# Patient Record
Sex: Male | Born: 1965 | Race: White | Hispanic: No | Marital: Married | Smoking: Current every day smoker
Health system: Southern US, Community
[De-identification: ages and names within clinical notes are randomized; demographics above are authoritative.]

## PROBLEM LIST (undated history)

## (undated) ENCOUNTER — Emergency Department (HOSPITAL_COMMUNITY): Payer: 59 | Source: Home / Self Care

## (undated) DIAGNOSIS — J449 Chronic obstructive pulmonary disease, unspecified: Secondary | ICD-10-CM

## (undated) DIAGNOSIS — G4730 Sleep apnea, unspecified: Secondary | ICD-10-CM

## (undated) DIAGNOSIS — F1020 Alcohol dependence, uncomplicated: Secondary | ICD-10-CM

## (undated) DIAGNOSIS — K219 Gastro-esophageal reflux disease without esophagitis: Secondary | ICD-10-CM

## (undated) DIAGNOSIS — F419 Anxiety disorder, unspecified: Secondary | ICD-10-CM

## (undated) DIAGNOSIS — N200 Calculus of kidney: Secondary | ICD-10-CM

## (undated) DIAGNOSIS — N400 Enlarged prostate without lower urinary tract symptoms: Secondary | ICD-10-CM

## (undated) DIAGNOSIS — Z87442 Personal history of urinary calculi: Secondary | ICD-10-CM

## (undated) DIAGNOSIS — E785 Hyperlipidemia, unspecified: Secondary | ICD-10-CM

## (undated) HISTORY — DX: Calculus of kidney: N20.0

## (undated) HISTORY — PX: APPENDECTOMY: SHX54

## (undated) HISTORY — PX: RHINOPLASTY: SUR1284

## (undated) HISTORY — PX: SHOULDER SURGERY: SHX246

## (undated) HISTORY — DX: Anxiety disorder, unspecified: F41.9

## (undated) HISTORY — DX: Alcohol dependence, uncomplicated: F10.20

---

## 1993-11-17 HISTORY — PX: NASAL SEPTOPLASTY W/ TURBINOPLASTY: SHX2070

## 1993-11-17 HISTORY — PX: RHINOPLASTY: SUR1284

## 1999-09-27 ENCOUNTER — Emergency Department (HOSPITAL_COMMUNITY): Admission: EM | Admit: 1999-09-27 | Discharge: 1999-09-27 | Payer: Self-pay | Admitting: Emergency Medicine

## 2001-11-17 HISTORY — PX: WISDOM TOOTH EXTRACTION: SHX21

## 2003-11-18 HISTORY — PX: APPENDECTOMY: SHX54

## 2003-11-18 HISTORY — PX: SHOULDER SURGERY: SHX246

## 2005-12-29 ENCOUNTER — Encounter: Admission: RE | Admit: 2005-12-29 | Discharge: 2005-12-29 | Payer: Self-pay | Admitting: Orthopedic Surgery

## 2006-01-28 ENCOUNTER — Ambulatory Visit (HOSPITAL_COMMUNITY): Admission: RE | Admit: 2006-01-28 | Discharge: 2006-01-28 | Payer: Self-pay | Admitting: Orthopedic Surgery

## 2006-01-28 HISTORY — PX: SHOULDER ARTHROSCOPY: SHX128

## 2006-08-28 ENCOUNTER — Ambulatory Visit (HOSPITAL_BASED_OUTPATIENT_CLINIC_OR_DEPARTMENT_OTHER): Admission: RE | Admit: 2006-08-28 | Discharge: 2006-08-28 | Payer: Self-pay | Admitting: Urology

## 2006-08-28 ENCOUNTER — Encounter (INDEPENDENT_AMBULATORY_CARE_PROVIDER_SITE_OTHER): Payer: Self-pay | Admitting: Specialist

## 2006-08-28 HISTORY — PX: CYSTOSCOPY W/ RETROGRADES: SHX1426

## 2006-09-03 ENCOUNTER — Ambulatory Visit (HOSPITAL_COMMUNITY): Admission: RE | Admit: 2006-09-03 | Discharge: 2006-09-03 | Payer: Self-pay | Admitting: Urology

## 2008-11-17 HISTORY — PX: CARDIAC CATHETERIZATION: SHX172

## 2008-11-18 ENCOUNTER — Encounter: Admission: RE | Admit: 2008-11-18 | Discharge: 2008-11-18 | Payer: Self-pay | Admitting: Orthopedic Surgery

## 2008-11-22 ENCOUNTER — Ambulatory Visit (HOSPITAL_COMMUNITY): Admission: RE | Admit: 2008-11-22 | Discharge: 2008-11-22 | Payer: Self-pay | Admitting: Orthopedic Surgery

## 2008-11-22 HISTORY — PX: SHOULDER ARTHROSCOPY: SHX128

## 2009-04-10 ENCOUNTER — Other Ambulatory Visit: Payer: Self-pay | Admitting: Emergency Medicine

## 2009-04-10 ENCOUNTER — Observation Stay (HOSPITAL_COMMUNITY): Admission: EM | Admit: 2009-04-10 | Discharge: 2009-04-11 | Payer: Self-pay | Admitting: Emergency Medicine

## 2010-04-19 IMAGING — CR DG CHEST 2V
2 series · 2 of 2 positions shown · non-contrast
Comparison: November 22, 2008

CLINICAL DATA: Chest pain, smoker, shortness of breath

CHEST - 2 VIEW

[w chest pa]
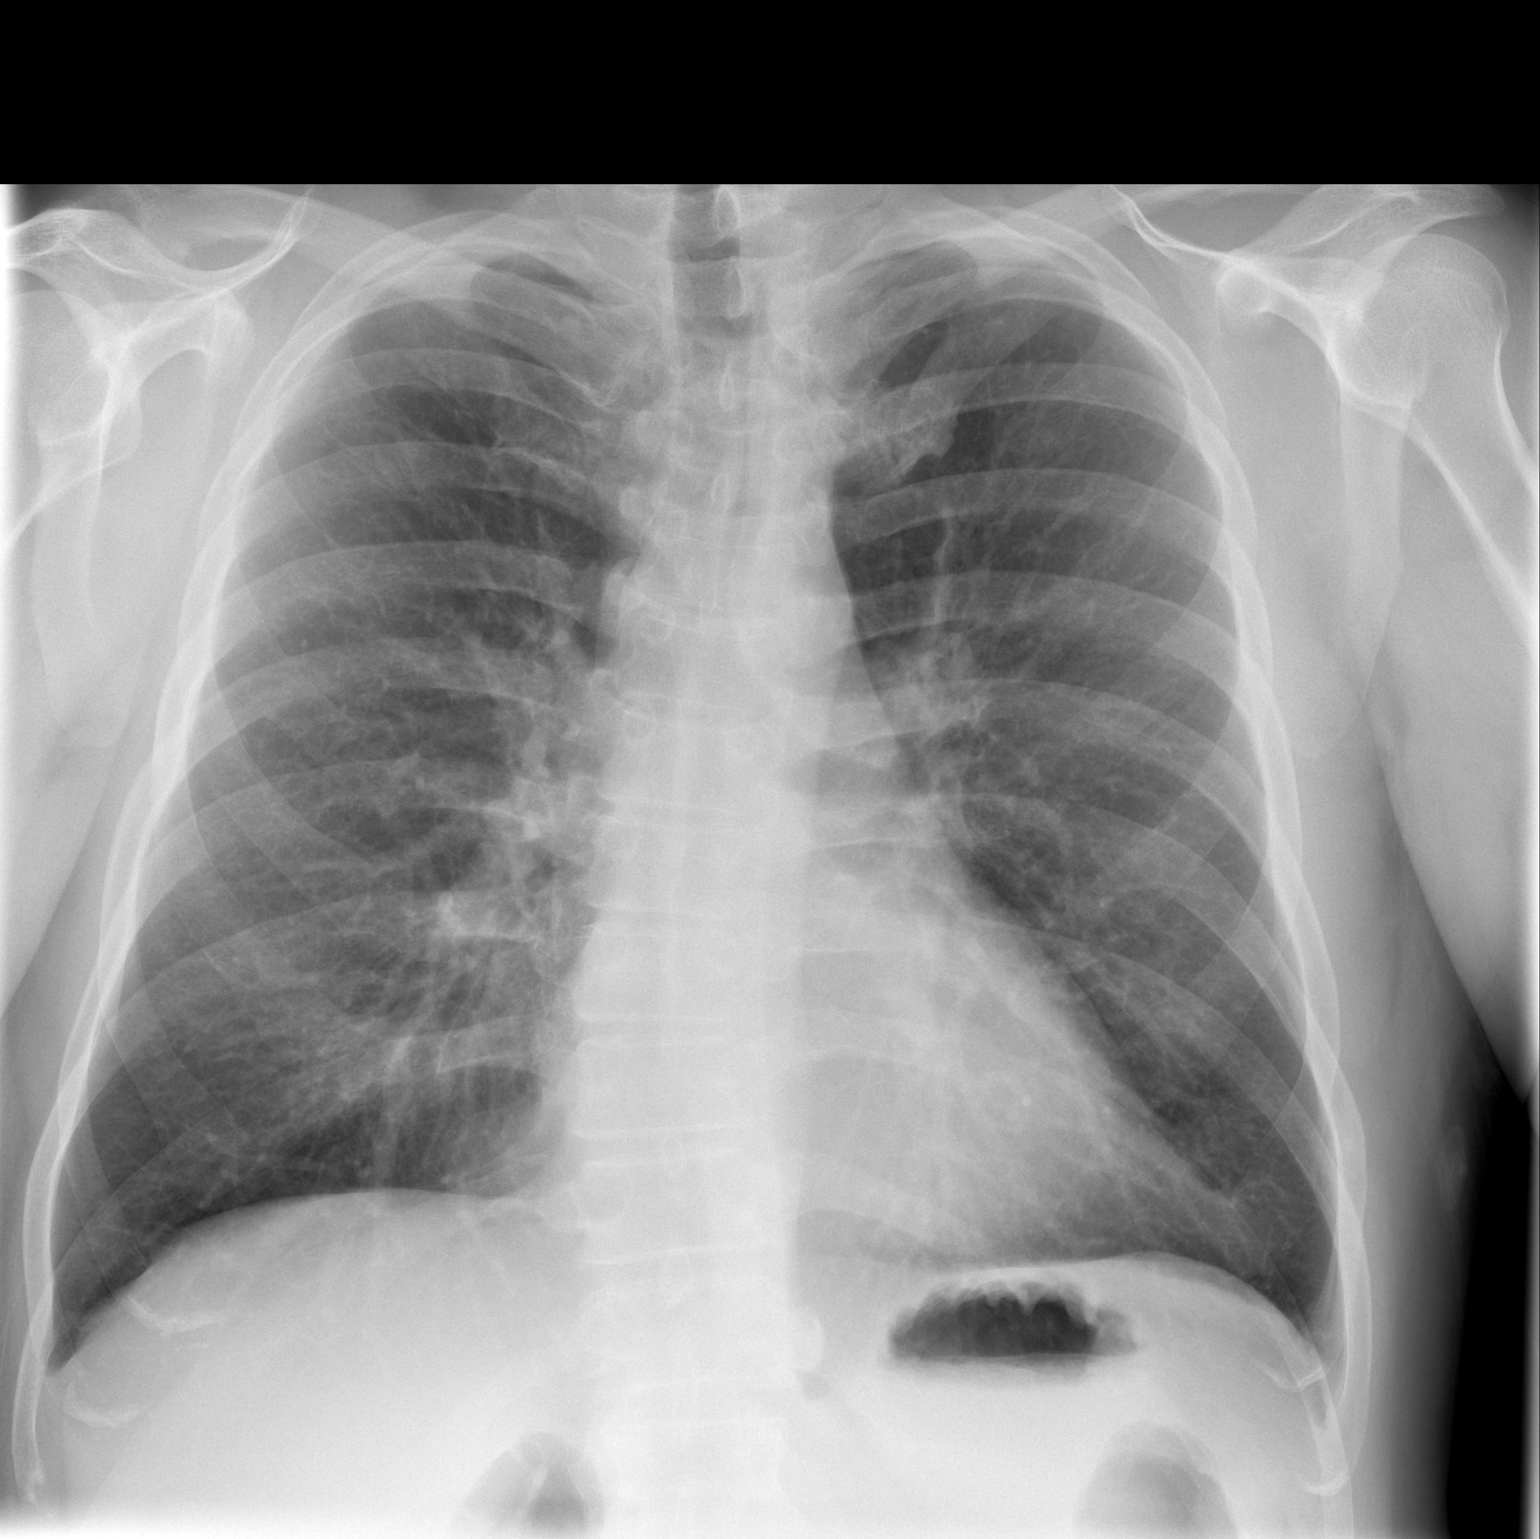

[w chest lat]
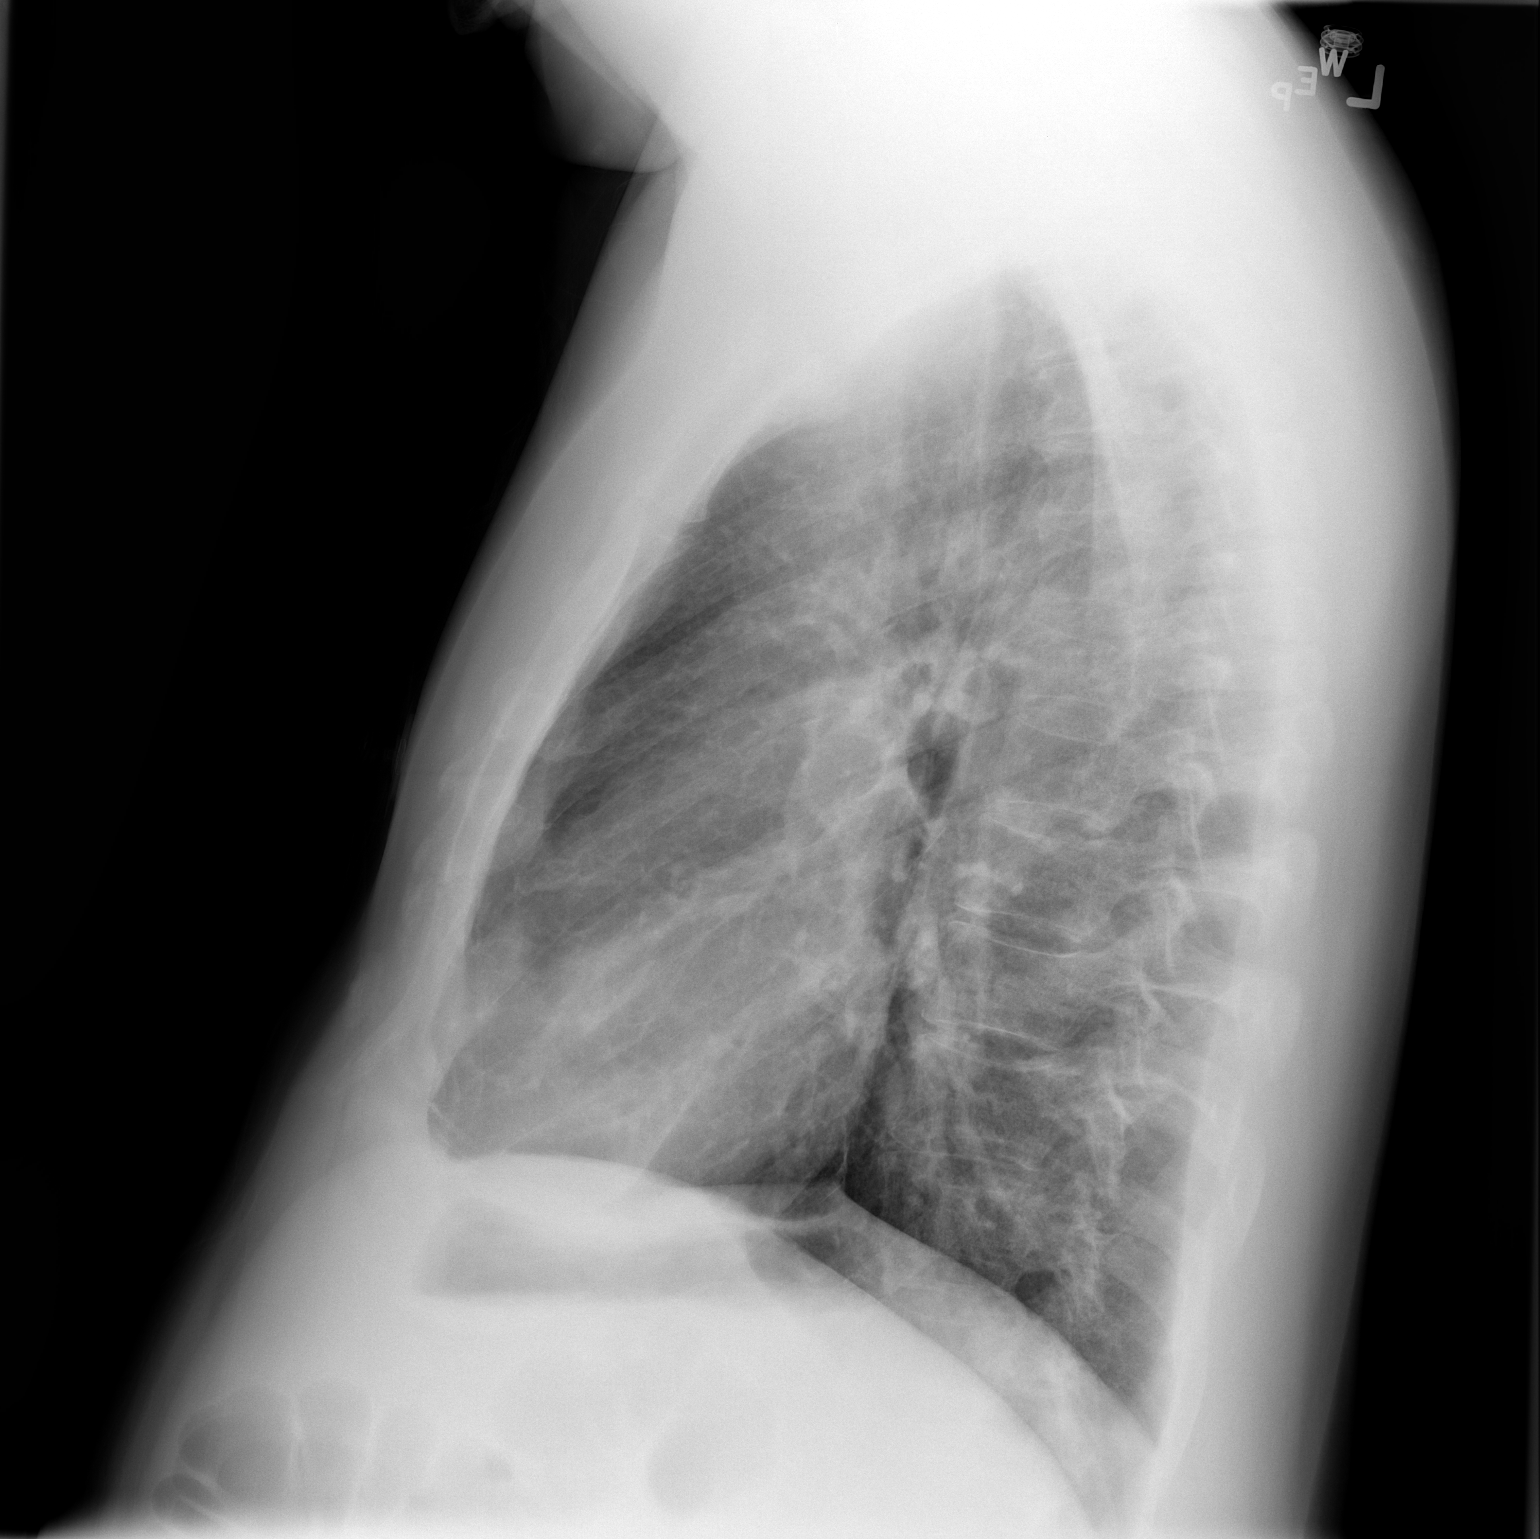

[2 of 2 positions shown; findings below may reference images not displayed]

FINDINGS: The cardiac silhouette, mediastinum, pulmonary
vasculature are within normal limits.  Both lungs are clear except
for mild, diffuse prominence of the interstitial markings, likely
related to chronic fibrotic change.  The osseous structures are
unremarkable.
IMPRESSION: Stable chest x-ray with no evidence of acute cardiac or pulmonary
process.

## 2010-06-04 ENCOUNTER — Ambulatory Visit (HOSPITAL_COMMUNITY): Admission: RE | Admit: 2010-06-04 | Discharge: 2010-06-04 | Payer: Self-pay | Admitting: Advanced Practice Midwife

## 2010-08-16 ENCOUNTER — Encounter: Admission: RE | Admit: 2010-08-16 | Discharge: 2010-08-16 | Payer: Self-pay | Admitting: Family Medicine

## 2011-01-21 ENCOUNTER — Emergency Department (HOSPITAL_COMMUNITY)
Admission: EM | Admit: 2011-01-21 | Discharge: 2011-01-22 | Disposition: A | Discharge status: Home / Self Care | Payer: BC Managed Care – PPO | Attending: Emergency Medicine | Admitting: Emergency Medicine

## 2011-01-21 DIAGNOSIS — R55 Syncope and collapse: Secondary | ICD-10-CM | POA: Insufficient documentation

## 2011-01-21 DIAGNOSIS — R209 Unspecified disturbances of skin sensation: Secondary | ICD-10-CM | POA: Insufficient documentation

## 2011-01-21 DIAGNOSIS — R42 Dizziness and giddiness: Secondary | ICD-10-CM | POA: Insufficient documentation

## 2011-01-21 DIAGNOSIS — G4730 Sleep apnea, unspecified: Secondary | ICD-10-CM | POA: Insufficient documentation

## 2011-01-21 DIAGNOSIS — K219 Gastro-esophageal reflux disease without esophagitis: Secondary | ICD-10-CM | POA: Insufficient documentation

## 2011-01-21 LAB — DIFFERENTIAL
Eosinophils Absolute: 0.1 10*3/uL (ref 0.0–0.7)
Eosinophils Relative: 1 % (ref 0–5)
Lymphocytes Relative: 25 % (ref 12–46)
Lymphs Abs: 2.8 10*3/uL (ref 0.7–4.0)
Monocytes Absolute: 0.8 10*3/uL (ref 0.1–1.0)

## 2011-01-21 LAB — CBC
HCT: 40.8 % (ref 39.0–52.0)
MCHC: 34.8 g/dL (ref 30.0–36.0)
MCV: 92.7 fL (ref 78.0–100.0)
Platelets: 205 10*3/uL (ref 150–400)
RDW: 13.9 % (ref 11.5–15.5)

## 2011-02-25 LAB — LIPID PANEL
Cholesterol: 165 mg/dL (ref 0–200)
HDL: 30 mg/dL — ABNORMAL LOW (ref >39)
Total CHOL/HDL Ratio: 5.5 RATIO
VLDL: 55 mg/dL — ABNORMAL HIGH (ref 0–40)

## 2011-02-25 LAB — CARDIAC PANEL(CRET KIN+CKTOT+MB+TROPI)
CK, MB: 0.7 ng/mL (ref 0.3–4.0)
CK, MB: 0.9 ng/mL (ref 0.3–4.0)
Relative Index: 0.5 (ref 0.0–2.5)
Total CK: 130 U/L (ref 7–232)
Total CK: 134 U/L (ref 7–232)
Total CK: 156 U/L (ref 7–232)
Troponin I: 0.01 ng/mL (ref 0.00–0.06)
Troponin I: 0.01 ng/mL (ref 0.00–0.06)

## 2011-02-25 LAB — COMPREHENSIVE METABOLIC PANEL
ALT: 20 U/L (ref 0–53)
AST: 20 U/L (ref 0–37)
Albumin: 3.9 g/dL (ref 3.5–5.2)
Chloride: 106 mEq/L (ref 96–112)
Creatinine, Ser: 1.05 mg/dL (ref 0.4–1.5)
GFR calc Af Amer: >60 mL/min (ref >60)
Potassium: 3.7 mEq/L (ref 3.5–5.1)
Sodium: 139 mEq/L (ref 135–145)
Total Bilirubin: 0.7 mg/dL (ref 0.3–1.2)

## 2011-02-25 LAB — CBC
Hemoglobin: 13.7 g/dL (ref 13.0–17.0)
MCHC: 35 g/dL (ref 30.0–36.0)
Platelets: 201 10*3/uL (ref 150–400)
Platelets: 229 10*3/uL (ref 150–400)
RBC: 4.41 MIL/uL (ref 4.22–5.81)
RDW: 14.3 % (ref 11.5–15.5)
WBC: 6.8 10*3/uL (ref 4.0–10.5)

## 2011-02-25 LAB — URINALYSIS, ROUTINE W REFLEX MICROSCOPIC
Bilirubin Urine: NEGATIVE
Glucose, UA: NEGATIVE mg/dL
Ketones, ur: 15 mg/dL — AB
pH: 7 (ref 5.0–8.0)

## 2011-02-25 LAB — DIFFERENTIAL
Basophils Absolute: 0 10*3/uL (ref 0.0–0.1)
Eosinophils Absolute: 0.1 10*3/uL (ref 0.0–0.7)
Eosinophils Relative: 2 % (ref 0–5)
Lymphocytes Relative: 39 % (ref 12–46)
Monocytes Absolute: 0.5 10*3/uL (ref 0.1–1.0)

## 2011-02-25 LAB — APTT: aPTT: 68 seconds — ABNORMAL HIGH (ref 24–37)

## 2011-02-25 LAB — POCT CARDIAC MARKERS
CKMB, poc: <1 ng/mL — ABNORMAL LOW (ref 1.0–8.0)
Myoglobin, poc: 59.6 ng/mL (ref 12–200)
Myoglobin, poc: 64.4 ng/mL (ref 12–200)
Troponin i, poc: <0.05 ng/mL (ref 0.00–0.09)
Troponin i, poc: <0.05 ng/mL (ref 0.00–0.09)

## 2011-02-25 LAB — BASIC METABOLIC PANEL
Calcium: 8.5 mg/dL (ref 8.4–10.5)
GFR calc Af Amer: >60 mL/min (ref >60)
GFR calc non Af Amer: >60 mL/min (ref >60)
Glucose, Bld: 126 mg/dL — ABNORMAL HIGH (ref 70–99)
Potassium: 3.8 mEq/L (ref 3.5–5.1)
Sodium: 142 mEq/L (ref 135–145)

## 2011-02-25 LAB — PROTIME-INR
INR: 1 (ref 0.00–1.49)
Prothrombin Time: 13.3 seconds (ref 11.6–15.2)

## 2011-02-25 LAB — TROPONIN I: Troponin I: 0.01 ng/mL (ref 0.00–0.06)

## 2011-02-25 LAB — CK TOTAL AND CKMB (NOT AT ARMC): Total CK: 135 U/L (ref 7–232)

## 2011-03-03 LAB — CBC
HCT: 42.8 % (ref 39.0–52.0)
Hemoglobin: 14.3 g/dL (ref 13.0–17.0)
MCHC: 33.4 g/dL (ref 30.0–36.0)
Platelets: 249 10*3/uL (ref 150–400)
RDW: 14 % (ref 11.5–15.5)

## 2011-03-03 LAB — URINALYSIS, ROUTINE W REFLEX MICROSCOPIC
Bilirubin Urine: NEGATIVE
Hgb urine dipstick: NEGATIVE
Nitrite: NEGATIVE
Protein, ur: NEGATIVE mg/dL
Urobilinogen, UA: 0.2 mg/dL (ref 0.0–1.0)

## 2011-03-03 LAB — BASIC METABOLIC PANEL
BUN: 11 mg/dL (ref 6–23)
CO2: 27 mEq/L (ref 19–32)
Calcium: 9.3 mg/dL (ref 8.4–10.5)
GFR calc non Af Amer: >60 mL/min (ref >60)
Glucose, Bld: 101 mg/dL — ABNORMAL HIGH (ref 70–99)
Potassium: 4.7 mEq/L (ref 3.5–5.1)
Sodium: 139 mEq/L (ref 135–145)

## 2011-03-18 DIAGNOSIS — R0789 Other chest pain: Secondary | ICD-10-CM

## 2011-03-18 HISTORY — DX: Other chest pain: R07.89

## 2011-03-18 HISTORY — PX: CARDIAC CATHETERIZATION: SHX172

## 2011-04-01 NOTE — Op Note (Signed)
NAMESEMISI, BIELA NO.:  [id number]   MEDICAL RECORD NO.:  [id number]          PATIENT TYPE:  OIB   LOCATION:  2899                         FACILITY:  [facility]   PHYSICIAN:  Dyke Brackett, M.D.    DATE OF BIRTH:  Oct 24, 1966   DATE OF PROCEDURE:  11/22/2008  DATE OF DISCHARGE:  11/22/2008                               OPERATIVE REPORT   INDICATIONS:  A 45 year old with intractable right shoulder pain.  MRI  suggesting labral pathology, impingement, AC arthritis, thought to be  amenable to outpatient surgery.   PREOPERATIVE DIAGNOSES:  1. Impingement right shoulder.  2. Labral tearing due to anterior superior tear.  3. Acromioclavicular joint arthritis.   PREOPERATIVE DIAGNOSES:  1. Impingement right shoulder.  2. Labral tearing due to anterior superior tear.  3. Acromioclavicular joint arthritis.   OPERATION:  1. Arthroscopic acromioplasty.  2. Arthroscopic debridement torn labrum.  3. Arthroscopic excision, distal clavicle.   SURGEON:  Dyke Brackett, MD   ASSISTANT:  __________.   ANESTHESIA:  __________.   PROCEDURE:  Examination was done under anesthesia, normal range of  motion.  No instability.  Arthroscope through 2 posterior portals,  lateral and anterior portal.  Systematic inspection of the shoulder  showed a degenerative tear in anterior superior labrum, debrided  separate from the subacromial area.  Undersurface of the cuff appeared  normal.  No significant glenohumeral degenerative change, but the labrum  was torn and debrided.   Subacromial space was extremely hypertrophied.  Significant impingement  noted.  Acromioplasty carried out with resection of about the anterior 6  mm of acromion, severe AC arthritis was encountered requiring resection  of a very  arthritic,  hypertrophic AC joint arthroscopically from the lateral and  anterior portal.  The superior surface of the cuff showed an abrasion  type phenomenon with no full-thickness  tear, bursectomy carried out.  Shoulder __________ closed with nylon, placed in a large sling, taken to  recovery room after a sterile dressing.      Dyke Brackett, M.D.  Electronically Signed     WDC/MEDQ  D:  11/22/2008  T:  11/23/2008  Job:  [id number]

## 2011-04-01 NOTE — Cardiovascular Report (Signed)
NAMEALIZE, ACY NO.:  [id number]   MEDICAL RECORD NO.:  [id number]          PATIENT TYPE:  INP   LOCATION:  4730                         FACILITY:  [facility]   PHYSICIAN:  Vesta Mixer, M.D. DATE OF BIRTH:  [birth year]   DATE OF PROCEDURE:  04/11/2009  DATE OF DISCHARGE:  04/11/2009                            CARDIAC CATHETERIZATION   Billy Thomas is a 45 year old gentleman with a history of  hypercholesterolemia.  He represented to the emergency room last night  with episodes of chest pain.  He is referred for heart catheterization  based on his symptoms.   The procedure was left heart catheterization with coronary angiography.   The right femoral artery was easily cannulated using modified Seldinger  technique.   HEMODYNAMICS:  LV pressure is 87/17 with an aortic pressure of 88/67.   ANGIOGRAPHY:  Left main.  The left main is smooth and normal.   The left anterior descending artery is smooth and normal.  The first  diagonal artery is normal.   The left circumflex artery is a moderate-sized vessel.  The obtuse  marginal artery is normal.   The ramus intermediate vessel is smooth and normal.   The right coronary artery is moderate in size and is dominant.  It is  smooth and normal.  The posterior descending artery and the  posterolateral segment artery are normal.   The left ventriculogram was performed in a 30 RAO position.  It reveals  normal left ventricular systolic function.  There is no significant  mitral regurgitation.   COMPLICATIONS:  None.   CONCLUSIONS:  1. Smooth and normal coronary arteries.  2. Normal left ventricular systolic function.   We will need to treat him for his hypertriglyceridemia.  Triglyceride  level in the upper 200s.  We will anticipate discharge later tonight.      Vesta Mixer, M.D.  Electronically Signed     PJN/MEDQ  D:  04/11/2009  T:  04/12/2009  Job:  [id number]   cc:   Donia Guiles, M.D.

## 2011-04-01 NOTE — H&P (Signed)
NAMEMarland Kitchen  Billy Thomas.:  [id number]   MEDICAL RECORD NO.:  [id number]          PATIENT TYPE:  EMS   LOCATION:  MAJO                         FACILITY:  [facility]   PHYSICIAN:  Vesta Mixer, M.D. DATE OF BIRTH:  09-23-1966   DATE OF ADMISSION:  04/10/2009  DATE OF DISCHARGE:  04/10/2009                              HISTORY & PHYSICAL   Billy Thomas is a 45 year old gentleman with a history of cigarette  smoking.  He also has a history of hyperlipidemia.  He is admitted with  symptoms consistent with unstable angina.   The patient is a very active man.  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  He  walks several miles a day without problems.   Several weeks ago, he had an episode of severe chest pain, diaphoresis,  dizziness, and palpitations.  The episode lasted 5 or 10 minutes.  Ever  since then he has had an unusual sensation in his chest.  He does not  describe specifically his pain, but describes it as a fullness or  tightness around his heart.  He also notes some unusual sensation that  radiates down his left arm.  He has had trouble catching his breath  since that time.  He is short of breath with minimal exertion.  This is  very unusual for him.  He has had some diaphoresis.  There is shortness  of breath.  There is no dizziness recently.  There is no PND or  orthopnea.  There is no cough or sputum production.   CURRENT MEDICATIONS:  None.   ALLERGIES:  None.   PAST MEDICAL HISTORY:  History of shoulder surgery.   SOCIAL HISTORY:  The patient owns a landscape business.  He smokes one  pack of cigarettes a day.   FAMILY HISTORY:  There is no cardiac disease.   REVIEW OF SYSTEMS:  Reviewed in the HPI.  All other systems were  reviewed and are negative.   PHYSICAL EXAMINATION:  GENERAL:  He is a middle-aged gentleman in no  acute distress.  He is alert and oriented x3 and his mood and affect are  normal.  HEENT:  Sclerae are nonicteric.  His mucous  membranes are moist.  NECK:  Supple.  His carotids are 2+ without bruits.  There is no JVD.  LUNGS:  Clear.  HEART:  Regular rate; S1, S2.  There are no murmurs, gallops, rubs.  His  PMI is nondisplaced.  ABDOMEN:  Good bowel sounds.  There is no hepatosplenomegaly.  There is  no guarding or rebound.  EXTREMITIES:  Pulses are 2+.  There is no edema.  There is no rash.  There is no palpable cords.  NEUROLOGIC:  Cranial nerves II through XII are intact and his motor and  sensory function intact.   His EKG reveals normal sinus rhythm.  There are no ST or T-wave changes.   LABORATORY DATA:  His Chem-7 and CBC are within normal limits.  His  point of care enzymes are negative so far.  Chest x-ray reveals clear  lung fields and a normal cardiac silhouette.  IMPRESSION AND PLAN:  Billy Thomas presents with some episodes of chest  discomfort.  He is a very concerning for unstable angina.  He clearly  has had an event that prevents him from doing even his normal  activities.  We have discussed the risks, benefits, and options of heart  catheterization.  Because of his symptoms, I recommended we proceed with  heart catheterization tomorrow.  He will be able to eat a liquid  breakfast and then we will keep him n.p.o. for possible cath around  lunch time.  Another possible time would be 5 o'clock tomorrow  afternoon.  All his other medical problems are stable.  We will draw  fasting lipids in the morning.      Vesta Mixer, M.D.  Electronically Signed     PJN/MEDQ  D:  04/10/2009  T:  04/11/2009  Job:  [id number]   cc:   Donia Guiles, M.D.

## 2011-04-01 NOTE — Discharge Summary (Signed)
[name], LOFQUIST NO.:  [id number]   MEDICAL RECORD NO.:  [id number]          PATIENT TYPE:  INP   LOCATION:  4730                         FACILITY:  [facility]   PHYSICIAN:  Vesta Mixer, M.D. DATE OF BIRTH:  [birth year]   DATE OF ADMISSION:  04/10/2009  DATE OF DISCHARGE:  04/11/2009                               DISCHARGE SUMMARY   DISCHARGE DIAGNOSES:  1. Noncardiac chest pain.  2. Hypertriglyceridemia.  3. Mild anxiety.   DISCHARGE MEDICATIONS:  1. Trilipix 135 mg a day.  2. Aspirin 81 mg a day.  3. Prilosec 20 mg a day over-the-counter.  4. Xanax 0.25 mg as needed as prescribed by Dr. Arvilla Market.   DISPOSITION:  The patient will see Dr. Elease Hashimoto in the office in a week or  so.  He is to eat a low-fat, low-salt diet, and is to exercise  regularly.   HISTORY:  Billy Thomas is a middle-aged gentleman who was admitted through the  emergency room with episodes of chest pain.  Please see dictated H and P  for further details.   HOSPITAL COURSE:  Chest pain.  The patient was ruled out for myocardial  infarction.  Because of his symptoms being so consistent with unstable  angina, we referred him for heart catheterization.  The heart  catheterization revealed smooth and normal coronary arteries.  He had  normal left ventricular systolic function.   He did quite well following his heart catheterization.  We will  discharge him in satisfactory condition.  We will see him in the office  in a week or so.  It is quite possible that he had gastritis or other GI  cause for his episodes of chest pain.  All of his other medical problems  are stable.  We have asked him to quit smoking.      Vesta Mixer, M.D.  Electronically Signed     PJN/MEDQ  D:  04/11/2009  T:  04/12/2009  Job:  [id number]   cc:   Donia Guiles, M.D.

## 2011-04-04 NOTE — Op Note (Signed)
NAME:  Billy Thomas, Billy Thomas NO.:  [id number]   MEDICAL RECORD NO.:  [id number]          PATIENT TYPE:  AMB   LOCATION:  SDS                          FACILITY:  [facility]   PHYSICIAN:  Dyke Brackett, M.D.    DATE OF BIRTH:  06-May-1966   DATE OF PROCEDURE:  01/28/2006  DATE OF DISCHARGE:                                 OPERATIVE REPORT   INDICATIONS FOR PROCEDURE:  The patient is a 45 year old who was scheduled  as an outpatient and converted over to the main OR due to severe sleep apnea  for shoulder arthroscopy of the left.   PREOPERATIVE DIAGNOSES:  1.  Impingement.  2.  Labral tearing.  3.  Acromioclavicular joint arthritis.   POSTOPERATIVE DIAGNOSES:  1.  Impingement.  2.  Labral tearing.  3.  Acromioclavicular joint arthritis.   PROCEDURE:  1.  Arthroscopic acromioplasty.  2.  Arthroscopic debridement of torn labrum.  3.  Arthroscopic excision of distal clavicle.   SURGEON:  Dyke Brackett, M.D.   ANESTHESIA:  General with a block.   DESCRIPTION OF PROCEDURE:  Normal range of motion.  No instability noted.  Arthroscope through two posterior portals for assessing the large shoulder  lateral and an anterior portal.  Systematic inspection of the glenohumeral  joint showed the patient to have no degenerative changes of the shoulder.  Fraying-type tear of the labrum was debrided without instability.  He  actually had a partial undersurface tear of about 20% of the thickness in  the full width of the supraspinatus which was debrided.  Biceps tendon  anchor was intact.  Debridement was carried out intra-articularly.  The  subacromial space showed hypertrophy and crowding by the acromion, so  release of the CA ligament.  Resection of the anterior leading edge of the  acromion of about 7 mm.  There was a very prominent arthritic distal  clavicle.  Resection was accomplished over about a centimeter of that with  the high-speed  bur.  Resection line was checked on  both the posterior and the lateral  portal.  Complete bursectomy carried out.  No full-thickness tear was  appreciated of the cuff.  Shoulder drained free of fluid.  Portals closed  with nylon.   Taken to the recovery room in stable condition.  Sling applied.      Dyke Brackett, M.D.  Electronically Signed     WDC/MEDQ  D:  01/28/2006  T:  01/29/2006  Job:  [id number]

## 2011-04-04 NOTE — Op Note (Signed)
NAMEMEDARDO, HASSING NO.:  [id number]   MEDICAL RECORD NO.:  [id number]          PATIENT TYPE:  AMB   LOCATION:  NESC                         FACILITY:  [facility]   PHYSICIAN:  Bertram Millard. Dahlstedt, M.D.DATE OF BIRTH:  1966/02/25   DATE OF PROCEDURE:  08/28/2006  DATE OF DISCHARGE:                                 OPERATIVE REPORT   Mr. Moffatt was a gentleman who has been seen by me for hematuria.  He has  had a negative evaluation except for the renal calculi.  He has had no  significant flank pain.  The calculi had been within his kidney and does not  look like they had been moving around.   Because of the persistent hematuria and his cigarette smoking, I have  recommended we proceed with cysto and retrogrades as well as ureteral  washings to rule out occult neoplasm.  He is aware of the procedures, as  well as risks and complications.  Desires to proceed.   DESCRIPTION OF PROCEDURE:  The patient was identified in the holding area,  administered preoperative IV antibiotics, taken to the operating room.  General anesthetic was administered.  Genitalia and perineum were prepped  and draped.  A 22-French panendoscope was advanced through his urethra which  was totally normal.  Prostatic urethra was not obstructed and normal.  His  bladder was entered inspected circumferentially.  In the posterior trigone  area. Tere was some erythematous mucosa which was not raised.  I saw no  tumors, no foreign bodies or trabeculations.  Ureteral orifices were normal  configuration location.   At this point I biopsied the erythematous area x3.  These were sent as  bladder biopsies.  The biopsy site was cauterized.  Bilateral renal washings  were then taken and sent as right and left renal washings, respectively.  At  this point retrograde pyelograms were performed.  The left ureter and  pyelocaliceal system was totally normal.  On the right, near the junction of  the upper and  midpole calyces, there seemed to be a filling defect.  This  did not necessarily correspond with area of calcification in the kidney.  Because of the filling defect, I thought it wise to proceed with flexible  ureteroscopy.  I dilated the distal ureter with inner sheath with 35 cm  ureteral access sheath.  This was done over a guidewire.  I then passed a  flexible ureteroscope over the guidewire up into the renal pelvis.  I did  not see any papillary lesions within the pyelocaliceal system on the right.  The filling defect which appeared retrograde was a fairly large renal  calculus which was rolling free in the posterior part of the upper pole  caliceal system.  This corresponded with a filling defect on the retrograde.   At this time I felt it worthwhile to proceed with treatment of the stone, as  it was most likely the cause of his recurrent hematuria.  I passed a 200  micron fiber up the ureteroscope.  Because of the patient's body habitus and  because of the flexibility  of the scope, I could not get a good shot at the  stone with the laser fiber.  Despite trying for approximately 10-15 minutes,  I could not fragment the stone appropriately.  I then passed a nitinol  basket through the working port.  I was easily able to grasp the stone, but  it really could not be brought down into the ureter appreciably.  I did  bring it up to the upper ureter but could not extract it any farther.  I  pushed it back into the renal pelvis and tried again to fragment it with the  laser fiber.  Again, it was very difficult to get this stone fragmented with  fiber.  At this point I felt it worthwhile to place a stent and treat him  with lithotripsy later.  I placed a 26-cm 5-French Polaris stent over the  guidewire using fluoroscopic guidance.  Once the guidewire was removed, good  proximal curl was seen.  The string was taken off of the stent.  The bladder  was drained.  The scope was removed.   The  patient tolerated the procedure well.  He was awakened, extubated, taken  to PACU in stable condition.  We will call to set up lithotripsy for him  early in the week.  He was discharged on 25 Vicodins, one p.o. q.4 h p.r.n.  pain, Urelle one p.o. q.6 h p.r.n. burning with urination and Bactrim DS 1  p.o. b.i.d. for 5 days.      Bertram Millard. Dahlstedt, M.D.  Electronically Signed     SMD/MEDQ  D:  08/28/2006  T:  08/31/2006  Job:  [id number]   cc:   Donia Guiles, M.D.  Fax: [phone]

## 2012-01-08 LAB — BASIC METABOLIC PANEL
BUN: 15 mg/dL (ref 4–21)
Creatinine: 1 mg/dL (ref <=1.3)
Glucose: 100 mg/dL
Potassium: 4.6 mmol/L (ref 3.4–5.3)
SODIUM: 139 mmol/L (ref 137–147)

## 2012-01-08 LAB — HEPATIC FUNCTION PANEL
ALK PHOS: 69 U/L (ref 25–125)
ALT: 24 U/L (ref 10–40)
AST: 16 U/L (ref 14–40)
Bilirubin, Total: 0.4 mg/dL

## 2012-01-08 LAB — LIPID PANEL
CHOLESTEROL: 174 mg/dL (ref 0–200)
HDL: 38 mg/dL (ref 35–70)
LDL Cholesterol: 112 mg/dL
TRIGLYCERIDES: 107 mg/dL (ref 40–160)

## 2012-10-01 ENCOUNTER — Telehealth: Payer: Self-pay | Admitting: *Deleted

## 2012-10-01 ENCOUNTER — Other Ambulatory Visit: Payer: Self-pay | Admitting: Internal Medicine

## 2012-10-01 MED ORDER — ALBUTEROL SULFATE HFA 108 (90 BASE) MCG/ACT IN AERS: 2.0000 | INHALATION_SPRAY | RESPIRATORY_TRACT | Status: DC | PRN

## 2012-10-01 NOTE — Telephone Encounter (Signed)
Needs chart. 

## 2012-10-01 NOTE — Telephone Encounter (Signed)
What is his follow up plan? Has not been seen for over one year? Last seen October 2012 for URI.

## 2012-10-01 NOTE — Telephone Encounter (Signed)
Will send in inhaler. Need office visit for further refills. We do not have a pharmacy on file, so please call in the inhaler.

## 2012-10-01 NOTE — Telephone Encounter (Signed)
Called and advised.

## 2012-10-01 NOTE — Telephone Encounter (Signed)
Pharmacy requests a refill for ventolin hfa inhaler.  1-2 puffs every 4-6 hours prn wheezing.  Last filled 08/30/11.  Chart is at nurses station for review.  Last ov was 08/30/11

## 2012-10-01 NOTE — Telephone Encounter (Signed)
I called his wife and she advised he uses occasionally, she was not aware he needs follow up. She will let him know he is due, I told her I will ask if we can get renewal one inhaler until he can come in.

## 2012-10-02 NOTE — Telephone Encounter (Signed)
Chart pulled to PA pool at nurses station [phone]

## 2012-12-16 ENCOUNTER — Other Ambulatory Visit: Payer: Self-pay | Admitting: Physician Assistant

## 2013-01-12 ENCOUNTER — Other Ambulatory Visit: Payer: Self-pay | Admitting: Family Medicine

## 2013-01-12 DIAGNOSIS — R109 Unspecified abdominal pain: Secondary | ICD-10-CM

## 2013-01-12 LAB — LIPID PANEL
CHOLESTEROL: 182 mg/dL (ref 0–200)
HDL: 49 mg/dL (ref 35–70)
LDL CALC: 117 mg/dL
Triglycerides: 70 mg/dL (ref 40–160)

## 2013-01-12 LAB — HEPATIC FUNCTION PANEL
ALK PHOS: 0.4 U/L — AB (ref 25–125)
ALT: 23 U/L (ref 10–40)
AST: 18 U/L (ref 14–40)
Bilirubin, Total: 0.4 mg/dL

## 2013-01-12 LAB — BASIC METABOLIC PANEL
BUN: 15 mg/dL (ref 4–21)
Creatinine: 1 mg/dL (ref <=1.3)
GLUCOSE: 91 mg/dL
Potassium: 4.5 mmol/L (ref 3.4–5.3)
Sodium: 141 mmol/L (ref 137–147)

## 2013-01-14 ENCOUNTER — Ambulatory Visit
Admission: RE | Admit: 2013-01-14 | Discharge: 2013-01-14 | Disposition: A | Discharge status: Home / Self Care | Payer: 59 | Source: Ambulatory Visit | Attending: Family Medicine | Admitting: Family Medicine

## 2013-01-14 ENCOUNTER — Other Ambulatory Visit: Payer: Self-pay | Admitting: Family Medicine

## 2013-01-14 DIAGNOSIS — R109 Unspecified abdominal pain: Secondary | ICD-10-CM

## 2013-01-14 MED ORDER — IOHEXOL 300 MG/ML  SOLN
125.0000 mL | Freq: Once | INTRAMUSCULAR | Status: AC | PRN
  Administered 2013-01-14: 125 mL via INTRAVENOUS

## 2013-11-17 HISTORY — PX: CYSTOSCOPY WITH HOLMIUM LASER LITHOTRIPSY: SHX6639

## 2016-04-22 ENCOUNTER — Encounter: Payer: Self-pay | Admitting: Family Medicine

## 2016-04-22 ENCOUNTER — Ambulatory Visit (INDEPENDENT_AMBULATORY_CARE_PROVIDER_SITE_OTHER): Payer: 59 | Admitting: Family Medicine

## 2016-04-22 VITALS — BP 96/57 | HR 69 | Ht 71.25 in | Wt 241.6 lb

## 2016-04-22 DIAGNOSIS — Z716 Tobacco abuse counseling: Secondary | ICD-10-CM

## 2016-04-22 DIAGNOSIS — Z9989 Dependence on other enabling machines and devices: Secondary | ICD-10-CM

## 2016-04-22 DIAGNOSIS — Z720 Tobacco use: Secondary | ICD-10-CM

## 2016-04-22 DIAGNOSIS — F411 Generalized anxiety disorder: Secondary | ICD-10-CM | POA: Diagnosis not present

## 2016-04-22 DIAGNOSIS — G4733 Obstructive sleep apnea (adult) (pediatric): Secondary | ICD-10-CM

## 2016-04-22 DIAGNOSIS — K219 Gastro-esophageal reflux disease without esophagitis: Secondary | ICD-10-CM | POA: Diagnosis not present

## 2016-04-22 DIAGNOSIS — E669 Obesity, unspecified: Secondary | ICD-10-CM

## 2016-04-22 DIAGNOSIS — F410 Panic disorder [episodic paroxysmal anxiety] without agoraphobia: Secondary | ICD-10-CM | POA: Diagnosis not present

## 2016-04-22 DIAGNOSIS — F1021 Alcohol dependence, in remission: Secondary | ICD-10-CM

## 2016-04-22 DIAGNOSIS — J449 Chronic obstructive pulmonary disease, unspecified: Secondary | ICD-10-CM

## 2016-04-22 DIAGNOSIS — N400 Enlarged prostate without lower urinary tract symptoms: Secondary | ICD-10-CM

## 2016-04-22 MED ORDER — TAMSULOSIN HCL 0.4 MG PO CAPS: 0.4000 mg | ORAL_CAPSULE | Freq: Every day | ORAL | Status: DC

## 2016-04-22 MED ORDER — OMEPRAZOLE 20 MG PO CPDR
20.0000 mg | DELAYED_RELEASE_CAPSULE | Freq: Every day | ORAL | Status: DC
Start: 2016-04-22 — End: 2017-03-02

## 2016-04-22 MED ORDER — ALPRAZOLAM 0.25 MG PO TABS: ORAL_TABLET | ORAL | Status: DC

## 2016-04-22 MED ORDER — ALBUTEROL SULFATE HFA 108 (90 BASE) MCG/ACT IN AERS: 2.0000 | INHALATION_SPRAY | RESPIRATORY_TRACT | Status: DC | PRN

## 2016-04-22 MED ORDER — FLUOXETINE HCL 20 MG PO TABS: ORAL_TABLET | ORAL | Status: DC

## 2016-04-22 NOTE — Patient Instructions (Signed)
Take meds as written Do something every day for you. F/up 2 wks.    Generalized Anxiety Disorder Generalized anxiety disorder (GAD) is a mental disorder. It interferes with life functions, including relationships, work, and school. GAD is different from normal anxiety, which everyone experiences at some point in their lives in response to specific life events and activities. Normal anxiety actually helps [place] prepare for and get through these life events and activities. Normal anxiety goes away after the event or activity is over.  GAD causes anxiety that is not necessarily related to specific events or activities. It also causes excess anxiety in proportion to specific events or activities. The anxiety associated with GAD is also difficult to control. GAD can vary from mild to severe. People with severe GAD can have intense waves of anxiety with physical symptoms (panic attacks).  SYMPTOMS The anxiety and worry associated with GAD are difficult to control. This anxiety and worry are related to many life events and activities and also occur more days than not for 6 months or longer. People with GAD also have three or more of the following symptoms (one or more in children):  Restlessness.   Fatigue.  Difficulty concentrating.   Irritability.  Muscle tension.  Difficulty sleeping or unsatisfying sleep. DIAGNOSIS GAD is diagnosed through an assessment by your health care provider. Your health care provider will ask you questions aboutyour mood,physical symptoms, and events in your life. Your health care provider may ask you about your medical history and use of alcohol or drugs, including prescription medicines. Your health care provider may also do a physical exam and blood tests. Certain medical conditions and the use of certain substances can cause symptoms similar to those associated with GAD. Your health care provider may refer you to a mental health specialist for further  evaluation. TREATMENT The following therapies are usually used to treat GAD:   Medication. Antidepressant medication usually is prescribed for long-term daily control. Antianxiety medicines may be added in severe cases, especially when panic attacks occur.   Talk therapy (psychotherapy). Certain types of talk therapy can be helpful in treating GAD by providing support, education, and guidance. A form of talk therapy called cognitive behavioral therapy can teach you healthy ways to think about and react to daily life events and activities.  Stress managementtechniques. These include yoga, meditation, and exercise and can be very helpful when they are practiced regularly. A mental health specialist can help determine which treatment is best for you. Some people see improvement with one therapy. However, other people require a combination of therapies.   This information is not intended to replace advice given to you by your health care provider. Make sure you discuss any questions you have with your health care provider.   Document Released: 02/28/2013 Document Revised: 11/24/2014 Document Reviewed: 02/28/2013 Elsevier Interactive Patient Education [employer].

## 2016-04-22 NOTE — Progress Notes (Signed)
Marjory Sneddon, D.O. Family Medicine Physician [facility] Location: Primary Care at [facility]     Subjective:    CC: New pt, here to establish care.   HPI: Billy Thomas is a pleasant 50 y.o. male who presents to [place] [facility] today to become established.    He is here to discuss his mood disorder.   His wife insisted that he come.  He also came today because Dr. Rex Kras will not refill his anxiolytic medicines anymore.   CC: Quick tempered. "demon can come out sometimes."  No homicidal ideation or hallucinations of hurting anyone else. Needs xanax to keep calm.  Now script Xanax- ran out 1-2 months ago, inhaler out for a while.   Easily overwhelmed.   [employer], shows horses and owns maintenance company.    HIgh stress- type A personality.  Tried several tabs in the past- ssri's and all- pt doesn't know names and they didn't work.  Gets panic attacks- about once a week or once every two weeks.    Mood disorder\Stress mgt: Ride horses daily. NO exercise.    Has a 49 year old and 49 year old and has been married for almost 30 years now. Is an Freight forwarder   BPH: uses flomax "to help pass his urine."  He doesn't know if he has a history of an enlarged prostate or not but after he fell from a bull, he had difficulties "passing his water" and needed to use the Flomax in order to go. Smoker: occ uses inhaler in the am with a lot phelgm he brings up.  ALso uses it as he works outside in yard and he gets dusty.  No desire to quit smoking.  GERD: sx daily and meds work well- well controlled on meds  OSA- wears CPAP machine nightly since 2008.   Alcoholic: [id number]- quit drinking.  Used to be a very heavy drinker     Past Medical History  Diagnosis Date  . Kidney stone 2010  . Alcoholism ([place])   . Alcoholism [facility])     recovering    Past Surgical History  Procedure Laterality Date  . Shoulder surgery Bilateral 10 years  ago  . Appendectomy    . Cardiac catheterization  2010    Family History  Problem Relation Age of Onset  . Cancer Father     lung  . Heart attack Paternal Grandfather   . Heart disease Paternal Grandfather     History  Drug Use No  ,  History  Alcohol Use No  ,  History  Smoking status  . Current Every Day Smoker -- 1.00 packs/day for 32 years  Smokeless tobacco  . Never Used  ,  History  Sexual Activity  . Sexual Activity: Yes  . Birth Control/ Protection: Post-menopausal    Patient's Medications  New Prescriptions   No medications on file  Previous Medications   ALBUTEROL (PROVENTIL HFA;VENTOLIN HFA) 108 (90 BASE) MCG/ACT INHALER    Inhale 2 puffs into the lungs every 4 (four) hours as needed for wheezing.   ALPRAZOLAM (XANAX) 0.25 MG TABLET    Take 0.25 mg by mouth 2 (two) times daily as needed for anxiety.   OMEPRAZOLE (PRILOSEC) 10 MG CAPSULE    Take 20 mg by mouth daily.   TAMSULOSIN (FLOMAX) 0.4 MG CAPS CAPSULE    Take 1 capsule by mouth daily.  Modified Medications   No medications on file  Discontinued Medications  No medications on file    ALLERGIES: Hydrocodone   Review of Systems: Full 14 point ROS performed via "adult medical history form".  Negative except for noted above    Objective:   Blood pressure 96/57, pulse 69, height 5' 11.25" (1.81 m), weight 241 lb 9.6 oz (109.589 kg). Body mass index is 33.45 kg/(m^2).  General: Well Developed, well nourished, and in no acute distress.  Neuro: Alert and oriented x3, extra-ocular muscles intact, sensation grossly intact.  HEENT: Normocephalic, atraumatic, pupils equal round reactive to light, neck supple, no gross masses, no carotid bruits, no JVD apprec Skin: no gross suspicious lesions or rashes  Cardiac: Regular rate and rhythm, no murmurs rubs or gallops.  Respiratory: Essentially clear to auscultation bilaterally. Not using accessory muscles, speaking in full sentences.  Abdominal: Soft, not  grossly distended Musculoskeletal: Ambulates w/o diff, FROM * 4 ext.  Vasc: less 2 sec cap RF, warm and pink  Psych:  No HI/SI, judgement and insight good.    Impression and Recommendations:    The patient was counselled, risk factors were discussed, anticipatory guidance given.  No problem-specific assessment & plan notes found for this encounter. Panic attack Patient knows to only use the Xanax when he gets panic attack and cannot control it through his own stress management methods.  I told him I anticipate him only using this once a week or less.  Risks benefits associated with med reviewed with patient.  GAD (generalized anxiety disorder) Stress management techniques reviewed with patient and that medication is only 1 spoke of the we'll then he needs to work on to control his mood disorder.  Risk and benefits of Prozac reviewed with patient we will start off with low dose and increase slowly.  I recommend counseling, exercise, healthy eating, and deep breathing/ meditation practices  OSA on CPAP He will continue to follow-up with his sleep medicine physician.  Probable early COPD I told him we did not officially diagnosed this however signs and symptoms are there any probably has early onset COPD. Strongly encouraged to quit smoking. Did prescribe Proventil when necessary and risk benefits discussed with patient  GERD (gastroesophageal reflux disease) Continue meds, weight loss, diet modification  BPH (benign prostatic hyperplasia) We can do a trial in the future of decreasing his mid dose and see how his symptoms are. I recommend he get a yearly physical including digital rectal exam in the near future.  Tobacco abuse No desire to quit or discuss quitting at this point.  Obesity Lifestyle modification discussed with patient: Exercise and nutrition counseling  Alcoholism in remission ([place]) Stable.    Note: This document was prepared using Dragon voice recognition  software and may include unintentional dictation errors.

## 2016-04-23 DIAGNOSIS — N400 Enlarged prostate without lower urinary tract symptoms: Secondary | ICD-10-CM | POA: Insufficient documentation

## 2016-04-23 DIAGNOSIS — F1021 Alcohol dependence, in remission: Secondary | ICD-10-CM | POA: Insufficient documentation

## 2016-04-23 DIAGNOSIS — E669 Obesity, unspecified: Secondary | ICD-10-CM | POA: Insufficient documentation

## 2016-04-23 DIAGNOSIS — G4733 Obstructive sleep apnea (adult) (pediatric): Secondary | ICD-10-CM | POA: Insufficient documentation

## 2016-04-23 DIAGNOSIS — Z720 Tobacco use: Secondary | ICD-10-CM | POA: Insufficient documentation

## 2016-04-23 DIAGNOSIS — J449 Chronic obstructive pulmonary disease, unspecified: Secondary | ICD-10-CM | POA: Insufficient documentation

## 2016-04-23 DIAGNOSIS — Z9989 Dependence on other enabling machines and devices: Secondary | ICD-10-CM

## 2016-04-23 DIAGNOSIS — K219 Gastro-esophageal reflux disease without esophagitis: Secondary | ICD-10-CM | POA: Insufficient documentation

## 2016-04-23 DIAGNOSIS — Z716 Tobacco abuse counseling: Secondary | ICD-10-CM | POA: Insufficient documentation

## 2016-04-23 NOTE — Assessment & Plan Note (Signed)
No desire to quit or discuss quitting at this point.

## 2016-04-23 NOTE — Assessment & Plan Note (Addendum)
Patient knows to only use the Xanax when he gets panic attack and cannot control it through his own stress management methods.  I told him I anticipate him only using this once a week or less.  Risks benefits associated with med reviewed with patient.

## 2016-04-23 NOTE — Assessment & Plan Note (Signed)
Stable

## 2016-04-23 NOTE — Assessment & Plan Note (Signed)
He will continue to follow-up with his sleep medicine physician.

## 2016-04-23 NOTE — Assessment & Plan Note (Signed)
I told him we did not officially diagnosed this however signs and symptoms are there any probably has early onset COPD. Strongly encouraged to quit smoking. Did prescribe Proventil when necessary and risk benefits discussed with patient

## 2016-04-23 NOTE — Assessment & Plan Note (Signed)
We can do a trial in the future of decreasing his mid dose and see how his symptoms are. I recommend he get a yearly physical including digital rectal exam in the near future.

## 2016-04-23 NOTE — Assessment & Plan Note (Signed)
Continue meds, weight loss, diet modification

## 2016-04-23 NOTE — Assessment & Plan Note (Signed)
Stress management techniques reviewed with patient and that medication is only 1 spoke of the we'll then he needs to work on to control his mood disorder.  Risk and benefits of Prozac reviewed with patient we will start off with low dose and increase slowly.  I recommend counseling, exercise, healthy eating, and deep breathing/ meditation practices

## 2016-04-23 NOTE — Assessment & Plan Note (Signed)
Lifestyle modification discussed with patient: Exercise and nutrition counseling

## 2016-05-05 ENCOUNTER — Ambulatory Visit (INDEPENDENT_AMBULATORY_CARE_PROVIDER_SITE_OTHER): Payer: 59 | Admitting: Family Medicine

## 2016-05-05 ENCOUNTER — Encounter: Payer: Self-pay | Admitting: Family Medicine

## 2016-05-05 VITALS — BP 121/72 | HR 72 | Temp 98.6°F | Ht 71.25 in | Wt 242.0 lb

## 2016-05-05 DIAGNOSIS — K219 Gastro-esophageal reflux disease without esophagitis: Secondary | ICD-10-CM | POA: Diagnosis not present

## 2016-05-05 DIAGNOSIS — B370 Candidal stomatitis: Secondary | ICD-10-CM

## 2016-05-05 DIAGNOSIS — F1020 Alcohol dependence, uncomplicated: Secondary | ICD-10-CM

## 2016-05-05 DIAGNOSIS — K148 Other diseases of tongue: Secondary | ICD-10-CM | POA: Insufficient documentation

## 2016-05-05 DIAGNOSIS — Z720 Tobacco use: Secondary | ICD-10-CM | POA: Diagnosis not present

## 2016-05-05 DIAGNOSIS — E669 Obesity, unspecified: Secondary | ICD-10-CM

## 2016-05-05 DIAGNOSIS — J384 Edema of larynx: Secondary | ICD-10-CM

## 2016-05-05 DIAGNOSIS — Z716 Tobacco abuse counseling: Secondary | ICD-10-CM | POA: Diagnosis not present

## 2016-05-05 DIAGNOSIS — F1021 Alcohol dependence, in remission: Secondary | ICD-10-CM

## 2016-05-05 MED ORDER — MAGIC MOUTHWASH W/LIDOCAINE: 15.0000 mL | Freq: Four times a day (QID) | ORAL | Status: DC | PRN

## 2016-05-05 MED ORDER — NYSTATIN 100000 UNIT/ML MT SUSP: 500000.0000 [IU] | Freq: Four times a day (QID) | OROMUCOSAL | Status: DC

## 2016-05-05 NOTE — Assessment & Plan Note (Signed)
Pre-contemplative stages

## 2016-05-05 NOTE — Progress Notes (Signed)
Subjective:    CC: mouth raw  HPI: Billy Thomas is a 50 y.o. male who presents to [place] at [facility] today for 2 mo h/o mouth feeling raw.  Been on and off occurring for 2 months now and sx bad since last wed.  Hurts to eat excpt soup- hurts to chew, tongue so sensistive, teeth feel sore, even hurts to smoke a cigarette.  2 months ago- dentist told him to use oralgel mouth rinse and dentist didn't think anything was wrong pathologically.   But now burning/ hurting more.   Never ever has he been told he had thrush.   Not interested in discussing txmnt options for smoking cessation. Knows it is bad for him but doesn't want to quit/ doesn't want to consider it now, or discuss it.      Past Medical History  Diagnosis Date  . Kidney stone 2010  . Alcoholism ([place])   . Alcoholism [facility])     recovering    Past Surgical History  Procedure Laterality Date  . Shoulder surgery Bilateral 10 years ago  . Appendectomy    . Cardiac catheterization  2010    Family History  Problem Relation Age of Onset  . Cancer Father     lung  . Heart attack Paternal Grandfather   . Heart disease Paternal Grandfather     History  Drug Use No  ,  History  Alcohol Use No  ,  History  Smoking status  . Current Every Day Smoker -- 1.00 packs/day for 32 years  Smokeless tobacco  . Never Used  ,  History  Sexual Activity  . Sexual Activity: Yes  . Birth Control/ Protection: Post-menopausal    Current Outpatient Prescriptions on File Prior to Visit  Medication Sig Dispense Refill  . albuterol (PROVENTIL HFA;VENTOLIN HFA) 108 (90 Base) MCG/ACT inhaler Inhale 2 puffs into the lungs every 4 (four) hours as needed for wheezing or shortness of breath. 1 Inhaler 1  . ALPRAZolam (XANAX) 0.25 MG tablet Use only as needed for panic attacks. 30 tablet 0  . FLUoxetine (PROZAC) 20 MG tablet 1/2 tab daily for one week then take one tab daily 90 tablet 1  . omeprazole (PRILOSEC) 20  MG capsule Take 1 capsule (20 mg total) by mouth daily. 90 capsule 1  . tamsulosin (FLOMAX) 0.4 MG CAPS capsule Take 1 capsule (0.4 mg total) by mouth daily. 30 capsule 1   No current facility-administered medications on file prior to visit.    Allergies  Allergen Reactions  . Hydrocodone Nausea Only     Review of Systems:  ( Completed via adult medical history intake form today ) General:  Denies fever, chills, appetite changes, unexplained weight loss.  Respiratory: Denies SOB, DOE, cough, wheezing.  Cardiovascular: Denies chest pain, palpitations.  Gastrointestinal: Denies nausea, vomiting, diarrhea, abdominal pain.  Genitourinary: Denies dysuria, increased frequency, flank pain. Endocrine: Denies hot or cold intolerance, polyuria, polydipsia. Musculoskeletal: Denies myalgias, back pain, joint swelling, arthralgias, gait problems.  Skin: Denies pallor, rash, suspicious lesions.  Neurological: Denies dizziness, seizures, syncope, unexplained weakness, lightheadedness, numbness and headaches.  Psychiatric/Behavioral: Denies mood changes, suicidal or homicidal ideations, hallucinations, sleep disturbances.   Objective:     Filed Vitals:   05/05/16 0845  Height: 5' 11.25" (1.81 m)  Weight: 242 lb (109.77 kg)   Blood pressure 121/72, pulse 72, temperature 98.6 F (37 C), temperature source Oral, height 5' 11.25" (1.81 m), weight 242 lb (  109.77 kg). Body mass index is 33.51 kg/(m^2). General: Well Developed, well nourished, and in no acute distress.  HEENT: Normocephalic, atraumatic, pupils equal round reactive to light, neck supple Oral: tongue and post OP slightly enlarged/edematous, erythematous/irritated appearance. One or two painless whitish colored patches on lat tongue.  No lesions, ulcerations, no palpable masses. Skin: Warm and dry, cap RF less 2 sec Cardiac: Regular rate and rhythm, S1, S2 WNL's, no murmurs rubs or gallops Respiratory: ECTA B/L, Not using accessory  muscles, speaking in full sentences. NeuroM-Sk: Ambulates w/o assistance, moves ext * 4 w/o difficulty, sensation grossly intact.  Psych: A and O *3, judgement and insight good.   No results found for this or any previous visit (from the past 2160 hour(s)).  Impression and Recommendations:    Pt is getting his labs done in near future- in 2 days.  Will add B12 to r/o def due to beefy red nature of tongue.    GERD (gastroesophageal reflux disease) Pt denies worsening GERD sx,  But take meds daily, avoid all citrus/ acidic foods at this time.  Tobacco abuse Pre-contemplative stages  Tobacco abuse counseling 2-70min counseling done.   Edema tongue - Avoid everything else except for prescribed meds. (no oralgel or other OTC oral rinses---Stop them all!  )  - We will r/o B12 defeciency in upcoming labs - explained to pt to use MM if having discomfort, otherwise use nystatin only if no pain  Alcoholism in remission [facility]) Denies use currently for many yrs  Obesity Will obtain labs near future  Candidiasis of mouth D/c pt use of meds and possible causes for sx. All Qs answered    Orders Placed This Encounter  Procedures  . Vitamin B12    Standing Status: Future     Number of Occurrences:      Standing Expiration Date: 05/05/2017  . VITAMIN D 25 Hydroxy (Vit-D Deficiency, Fractures)    Standing Status: Future     Number of Occurrences:      Standing Expiration Date: 05/05/2017  . TSH    Standing Status: Future     Number of Occurrences:      Standing Expiration Date: 05/05/2017  . Hemoglobin A1c    Standing Status: Future     Number of Occurrences:      Standing Expiration Date: 05/05/2017  . CBC    Standing Status: Future     Number of Occurrences:      Standing Expiration Date: 05/05/2017  . Comprehensive metabolic panel    Standing Status: Future     Number of Occurrences:      Standing Expiration Date: 05/05/2017    Order Specific Question:  Has the patient fasted?     Answer:  No  . Lipid panel    Standing Status: Future     Number of Occurrences:      Standing Expiration Date: 05/05/2017    Order Specific Question:  Has the patient fasted?    Answer:  No    Gross side effects, risk and benefits, and alternatives of medications discussed with patient.  Patient is aware that all medications have potential side effects and we are unable to predict every sideeffect or drug-drug interaction that may occur.  Expresses verbal understanding and consents to current therapy plan and treatment regiment.  Note: This document was prepared using Dragon voice recognition software and may include unintentional dictation errors.

## 2016-05-05 NOTE — Assessment & Plan Note (Signed)
2-85min counseling done.

## 2016-05-05 NOTE — Assessment & Plan Note (Signed)
Pt denies worsening GERD sx,  But take meds daily, avoid all citrus/ acidic foods at this time.

## 2016-05-05 NOTE — Assessment & Plan Note (Signed)
Denies use currently for many yrs

## 2016-05-05 NOTE — Patient Instructions (Signed)
Thrush, Adult  Thrush, also called oral candidiasis, is a fungal infection that develops in the mouth and throat and on the tongue. It causes white patches to form on the mouth and tongue. Thrush is most common in older adults, but it can occur at any age.   Many cases of thrush are mild, but this infection can also be more serious. Thrush can be a recurring problem for people who have chronic illnesses or who take medicines that limit the body's ability to fight infection. Because these people have difficulty fighting infections, the fungus that causes thrush can spread throughout the body. This can cause life-threatening blood or organ infections.  CAUSES   Thrush is usually caused by a yeast called Candida albicans. This fungus is normally present in small amounts in the mouth and on other mucous membranes. It usually causes no harm. However, when conditions are present that allow the fungus to grow uncontrolled, it invades surrounding tissues and becomes an infection. Less often, other Candida species can also lead to thrush.   RISK FACTORS  Thrush is more likely to develop in the following people:  · People with an impaired ability to fight infection (weakened immune system).    · Older adults.    · People with HIV.    · People with diabetes.    · People with dry mouth (xerostomia).    · Pregnant women.    · People with poor dental care, especially those who have false teeth.    · People who use antibiotic medicines.    SIGNS AND SYMPTOMS   Thrush can be a mild infection that causes no symptoms. If symptoms develop, they may include:   · A burning feeling in the mouth and throat. This can occur at the start of a thrush infection.    · White patches that adhere to the mouth and tongue. The tissue around the patches may be red, raw, and painful. If rubbed (during tooth brushing, for example), the patches and the tissue of the mouth may bleed easily.    · A bad taste in the mouth or difficulty tasting foods.     · Cottony feeling in the mouth.    · Pain during eating and swallowing.  DIAGNOSIS   Your health care provider can usually diagnose thrush by looking in your mouth and asking you questions about your health.   TREATMENT   Medicines that help prevent the growth of fungi (antifungals) are the standard treatment for thrush. These medicines are either applied directly to the affected area (topical) or swallowed (oral). The treatment will depend on the severity of the condition.   Mild Thrush  Mild cases of thrush may clear up with the use of an antifungal mouth rinse or lozenges. Treatment usually lasts about 14 days.   Moderate to Severe Thrush  · More severe thrush infections that have spread to the esophagus are treated with an oral antifungal medicine. A topical antifungal medicine may also be used.    · For some severe infections, a treatment period longer than 14 days may be needed.    · Oral antifungal medicines are almost never used during pregnancy because the fetus may be harmed. However, if a pregnant woman has a rare, severe thrush infection that has spread to her blood, oral antifungal medicines may be used. In this case, the risk of harm to the mother and fetus from the severe thrush infection may be greater than the risk posed by the use of antifungal medicines.    Persistent or Recurrent Thrush  For cases of   thrush that do not go away or keep coming back, treatment may involve the following:   · Treatment may be needed twice as long as the symptoms last.    · Treatment will include both oral and topical antifungal medicines.    · People with weakened immune systems can take an antifungal medicine on a continuous basis to prevent thrush infections.    It is important to treat conditions that make you more likely to get thrush, such as diabetes or HIV.   HOME CARE INSTRUCTIONS   · Only take over-the-counter or prescription medicine as directed by your health care provider. Talk to your health care  provider about an over-the-counter medicine called gentian violet, which kills bacteria and fungi.    · Eat plain, unflavored yogurt as directed by your health care provider. Check the label to make sure the yogurt contains live cultures. This yogurt can help healthy bacteria grow in the mouth that can stop the growth of the fungus that causes thrush.    · Try these measures to help reduce the discomfort of thrush:      Drink cold liquids such as water or iced tea.      Try flavored ice treats or frozen juices.      Eat foods that are easy to swallow, such as gelatin, ice cream, or custard.      If the patches in your mouth are painful, try drinking from a straw.    · Rinse your mouth several times a day with a warm saltwater rinse. You can make the saltwater mixture with 1 tsp (6 g) of salt in 8 fl oz (0.2 L) of warm water.    · If you wear dentures, remove the dentures before going to bed, brush them vigorously, and soak them in a cleaning solution as directed by your health care provider.    · Women who are breastfeeding should clean their nipples with an antifungal medicine as directed by their health care provider. Dry the nipples after breastfeeding. Applying lanolin-containing body lotion may help relieve nipple soreness.    SEEK MEDICAL CARE IF:  · Your symptoms are getting worse or are not improving within 7 days of starting treatment.    · You have symptoms of spreading infection, such as white patches on the skin outside of the mouth.    · You are nursing and you have redness, burning, or pain in the nipples that is not relieved with treatment.    MAKE SURE YOU:  · Understand these instructions.  · Will watch your condition.  · Will get help right away if you are not doing well or get worse.     This information is not intended to replace advice given to you by your health care provider. Make sure you discuss any questions you have with your health care provider.     Document Released: 07/29/2004 Document  Revised: 11/24/2014 Document Reviewed: 06/06/2013  Elsevier Interactive Patient Education ©2016 [employer]

## 2016-05-05 NOTE — Assessment & Plan Note (Signed)
D/c pt use of meds and possible causes for sx. All Qs answered

## 2016-05-05 NOTE — Assessment & Plan Note (Addendum)
-   Avoid everything else except for prescribed meds. (no oralgel or other OTC oral rinses---Stop them all!  )  - We will r/o B12 defeciency in upcoming labs - explained to pt to use MM if having discomfort, otherwise use nystatin only if no pain

## 2016-05-05 NOTE — Assessment & Plan Note (Signed)
Will obtain labs near future

## 2016-05-07 ENCOUNTER — Other Ambulatory Visit (INDEPENDENT_AMBULATORY_CARE_PROVIDER_SITE_OTHER): Payer: 59

## 2016-05-07 DIAGNOSIS — J384 Edema of larynx: Secondary | ICD-10-CM | POA: Diagnosis not present

## 2016-05-07 DIAGNOSIS — F1021 Alcohol dependence, in remission: Secondary | ICD-10-CM

## 2016-05-07 DIAGNOSIS — F1020 Alcohol dependence, uncomplicated: Secondary | ICD-10-CM

## 2016-05-07 DIAGNOSIS — K219 Gastro-esophageal reflux disease without esophagitis: Secondary | ICD-10-CM

## 2016-05-07 DIAGNOSIS — B370 Candidal stomatitis: Secondary | ICD-10-CM

## 2016-05-07 DIAGNOSIS — Z720 Tobacco use: Secondary | ICD-10-CM

## 2016-05-07 DIAGNOSIS — K148 Other diseases of tongue: Secondary | ICD-10-CM

## 2016-05-07 DIAGNOSIS — E669 Obesity, unspecified: Secondary | ICD-10-CM

## 2016-05-08 LAB — COMPREHENSIVE METABOLIC PANEL
ALK PHOS: 76 U/L (ref 40–115)
ALT: 16 U/L (ref 9–46)
AST: 14 U/L (ref 10–40)
Albumin: 4.3 g/dL (ref 3.6–5.1)
BUN: 19 mg/dL (ref 7–25)
CALCIUM: 9.2 mg/dL (ref 8.6–10.3)
CO2: 27 mmol/L (ref 20–31)
Chloride: 103 mmol/L (ref 98–110)
Creat: 0.91 mg/dL (ref 0.60–1.35)
GLUCOSE: 103 mg/dL — AB (ref 65–99)
POTASSIUM: 4.6 mmol/L (ref 3.5–5.3)
Sodium: 142 mmol/L (ref 135–146)
Total Bilirubin: 0.4 mg/dL (ref 0.2–1.2)
Total Protein: 6.8 g/dL (ref 6.1–8.1)

## 2016-05-08 LAB — CBC
HEMATOCRIT: 40.7 % (ref 38.5–50.0)
HEMOGLOBIN: 13.8 g/dL (ref 13.2–17.1)
MCH: 31.7 pg (ref 27.0–33.0)
MCHC: 33.9 g/dL (ref 32.0–36.0)
MCV: 93.3 fL (ref 80.0–100.0)
MPV: 10 fL (ref 7.5–12.5)
Platelets: 268 10*3/uL (ref 140–400)
RBC: 4.36 MIL/uL (ref 4.20–5.80)
RDW: 13.7 % (ref 11.0–15.0)
WBC: 5.8 10*3/uL (ref 3.8–10.8)

## 2016-05-08 LAB — VITAMIN D 25 HYDROXY (VIT D DEFICIENCY, FRACTURES): VIT D 25 HYDROXY: 41 ng/mL (ref 30–100)

## 2016-05-08 LAB — VITAMIN B12: Vitamin B-12: 267 pg/mL (ref 200–1100)

## 2016-05-08 LAB — LIPID PANEL
CHOL/HDL RATIO: 4 ratio (ref <=5.0)
Cholesterol: 153 mg/dL (ref 125–200)
HDL: 38 mg/dL — AB (ref >=40)
LDL Cholesterol: 96 mg/dL (ref <130)
Triglycerides: 96 mg/dL (ref <150)
VLDL: 19 mg/dL (ref <30)

## 2016-05-08 LAB — HEMOGLOBIN A1C
Hgb A1c MFr Bld: 6.2 % — ABNORMAL HIGH (ref <5.7)
Mean Plasma Glucose: 131 mg/dL

## 2016-05-08 LAB — TSH: TSH: 1.07 mIU/L (ref 0.40–4.50)

## 2016-06-23 ENCOUNTER — Other Ambulatory Visit: Payer: Self-pay | Admitting: Family Medicine

## 2016-07-07 ENCOUNTER — Other Ambulatory Visit: Payer: Self-pay | Admitting: Family Medicine

## 2016-07-08 ENCOUNTER — Telehealth: Payer: Self-pay

## 2016-07-08 NOTE — Telephone Encounter (Signed)
Informed pt that per Dr. Raliegh Scarlet, he needs OV to evaluate asthma and frequency of use of albuterol inhaler.  Pt expressed understanding and is agreeable.  Pt stated that he will call back to schedule OV once he looks at his schedule. Charyl Bigger, CMA

## 2016-08-12 ENCOUNTER — Ambulatory Visit (INDEPENDENT_AMBULATORY_CARE_PROVIDER_SITE_OTHER): Payer: 59 | Admitting: Family Medicine

## 2016-08-12 ENCOUNTER — Encounter: Payer: Self-pay | Admitting: Family Medicine

## 2016-08-12 VITALS — BP 107/67 | HR 66 | Temp 98.1°F | Ht 71.25 in | Wt 230.5 lb

## 2016-08-12 DIAGNOSIS — R062 Wheezing: Secondary | ICD-10-CM | POA: Diagnosis not present

## 2016-08-12 DIAGNOSIS — J449 Chronic obstructive pulmonary disease, unspecified: Secondary | ICD-10-CM

## 2016-08-12 DIAGNOSIS — F410 Panic disorder [episodic paroxysmal anxiety] without agoraphobia: Secondary | ICD-10-CM

## 2016-08-12 DIAGNOSIS — F411 Generalized anxiety disorder: Secondary | ICD-10-CM

## 2016-08-12 DIAGNOSIS — J209 Acute bronchitis, unspecified: Secondary | ICD-10-CM

## 2016-08-12 DIAGNOSIS — F17200 Nicotine dependence, unspecified, uncomplicated: Secondary | ICD-10-CM | POA: Diagnosis not present

## 2016-08-12 DIAGNOSIS — Z720 Tobacco use: Secondary | ICD-10-CM | POA: Diagnosis not present

## 2016-08-12 MED ORDER — PREDNISONE 20 MG PO TABS: ORAL_TABLET | ORAL | 0 refills | Status: DC

## 2016-08-12 MED ORDER — AZITHROMYCIN 250 MG PO TABS: ORAL_TABLET | ORAL | 0 refills | Status: DC

## 2016-08-12 MED ORDER — ALPRAZOLAM 0.25 MG PO TABS: ORAL_TABLET | ORAL | 0 refills | Status: DC

## 2016-08-12 MED ORDER — ALBUTEROL SULFATE HFA 108 (90 BASE) MCG/ACT IN AERS: INHALATION_SPRAY | RESPIRATORY_TRACT | 1 refills | Status: DC

## 2016-08-12 NOTE — Patient Instructions (Addendum)
Take prednisone daily as written. Over the next few days if you're not feeling better, then start the Zithromax Z-Pak.  Also, once you over this acute illness a few are using the albuterol more than twice weekl, then call me so we can order PFT's.      Acute Bronchitis Bronchitis is inflammation of the airways that extend from the windpipe into the lungs (bronchi). The inflammation often causes mucus to develop. This leads to a cough, which is the most common symptom of bronchitis.  In acute bronchitis, the condition usually develops suddenly and goes away over time, usually in a couple weeks. Smoking, allergies, and asthma can make bronchitis worse. Repeated episodes of bronchitis may cause further lung problems.  CAUSES Acute bronchitis is most often caused by the same virus that causes a cold. The virus can spread from person to person (contagious) through coughing, sneezing, and touching contaminated objects. SIGNS AND SYMPTOMS   Cough.   Fever.   Coughing up mucus.   Body aches.   Chest congestion.   Chills.   Shortness of breath.   Sore throat.  DIAGNOSIS  Acute bronchitis is usually diagnosed through a physical exam. Your health care provider will also ask you questions about your medical history. Tests, such as chest X-rays, are sometimes done to rule out other conditions.  TREATMENT  Acute bronchitis usually goes away in a couple weeks. Oftentimes, no medical treatment is necessary. Medicines are sometimes given for relief of fever or cough. Antibiotic medicines are usually not needed but may be prescribed in certain situations. In some cases, an inhaler may be recommended to help reduce shortness of breath and control the cough. A cool mist vaporizer may also be used to help thin bronchial secretions and make it easier to clear the chest.  HOME CARE INSTRUCTIONS  Get plenty of rest.   Drink enough fluids to keep your urine clear or pale yellow (unless you have a  medical condition that requires fluid restriction). Increasing fluids may help thin your respiratory secretions (sputum) and reduce chest congestion, and it will prevent dehydration.   Take medicines only as directed by your health care provider.  If you were prescribed an antibiotic medicine, finish it all even if you start to feel better.  Avoid smoking and secondhand smoke. Exposure to cigarette smoke or irritating chemicals will make bronchitis worse. If you are a smoker, consider using nicotine gum or skin patches to help control withdrawal symptoms. Quitting smoking will help your lungs heal faster.   Reduce the chances of another bout of acute bronchitis by washing your hands frequently, avoiding people with cold symptoms, and trying not to touch your hands to your mouth, nose, or eyes.   Keep all follow-up visits as directed by your health care provider.  SEEK MEDICAL CARE IF: Your symptoms do not improve after 1 week of treatment.  SEEK IMMEDIATE MEDICAL CARE IF:  You develop an increased fever or chills.   You have chest pain.   You have severe shortness of breath.  You have bloody sputum.   You develop dehydration.  You faint or repeatedly feel like you are going to pass out.  You develop repeated vomiting.  You develop a severe headache. MAKE SURE YOU:   Understand these instructions.  Will watch your condition.  Will get help right away if you are not doing well or get worse.   This information is not intended to replace advice given to you by your health care  provider. Make sure you discuss any questions you have with your health care provider.   Document Released: 12/11/2004 Document Revised: 11/24/2014 Document Reviewed: 04/26/2013 Elsevier Interactive Patient Education [employer].

## 2016-08-12 NOTE — Progress Notes (Signed)
Assessment and plan:  1. Acute bronchitis, unspecified organism   2. Wheezes   3. Tobacco abuse   4. Tobacco use disorder   5. GAD (generalized anxiety disorder)    1, 2)  - Viral vs Allergic vs Bacterial causes for pt's symptoms reveiwed.      Take prednisone daily as written. Over the next few days if you're not feeling better, then start the Zithromax Z-Pak.   Also, once you over this acute illness a few are using the albuterol more than twice weekl, then call me so we can order PFT's.   - Supportive care and various OTC medications discussed in addition to any prescribed.  - Call or RTC if new symptoms, or if no improvement or worse over next couple days.  3)    - Strongly encouraged to quit smoking. Patient declined medications and will do it on his own. Counseling performed.  4)  - I will refill Xanax for now.   Patient understands 1 prescription every 6 months or so, I am okay with.     He knows I'm not okay with regular use of benzodiazepines as this is not how we treat anxiety that occurs more frequently.  Only use when necessary panic.  Patient verbally agrees only to get him from me.  Meds ordered this encounter  Medications  . ALPRAZolam (XANAX) 0.25 MG tablet    Sig: Use only as needed for panic attacks.    Dispense:  30 tablet    Refill:  0  . azithromycin (ZITHROMAX Z-PAK) 250 MG tablet    Sig: As written    Dispense:  6 each    Refill:  0  . predniSONE (DELTASONE) 20 MG tablet    Sig: Take 3 pills a day for 2 days, 2 pills a day for 2 days, 1 pill a day for 2 days then one half pill a day for 2 days then off    Dispense:  14 tablet    Refill:  0  . albuterol (VENTOLIN HFA) 108 (90 Base) MCG/ACT inhaler    Sig: INHALE 2 PUFFS 2 TIMES A DAY AS NEEDED.    Dispense:  18 g    Refill:  1    New Prescriptions   AZITHROMYCIN (ZITHROMAX Z-PAK) 250 MG TABLET    As written   PREDNISONE (DELTASONE) 20 MG TABLET    Take 3 pills a day for 2 days, 2 pills a day  for 2 days, 1 pill a day for 2 days then one half pill a day for 2 days then off    Modified Medications   Modified Medication Previous Medication   ALBUTEROL (VENTOLIN HFA) 108 (90 BASE) MCG/ACT INHALER VENTOLIN HFA 108 (90 Base) MCG/ACT inhaler      INHALE 2 PUFFS 2 TIMES A DAY AS NEEDED.    INHALE 2 PUFFS 4 TIMES A DAY AS NEEDED.   ALPRAZOLAM (XANAX) 0.25 MG TABLET ALPRAZolam (XANAX) 0.25 MG tablet      Use only as needed for panic attacks.    Use only as needed for panic attacks.    Discontinued Medications   FLUOXETINE (PROZAC) 20 MG TABLET    1/2 tab daily for one week then take one tab daily   MAGIC MOUTHWASH W/LIDOCAINE SOLN    Take 15 mLs by mouth 4 (four) times daily as needed for mouth pain. Swish, gargle and spit.   NYSTATIN (MYCOSTATIN) 100000 UNIT/ML SUSPENSION    Take 5 mLs (500,000 Units  total) by mouth 4 (four) times daily. Swish for 30 seconds and spit out.    Anticipatory guidance and routine counseling done re: condition, txmnt options and need for follow up. All questions of patient's were answered.   Gross side effects, risk and benefits, and alternatives of medications discussed with patient.  Patient is aware that all medications have potential side effects and we are unable to predict every sideeffect or drug-drug interaction that may occur.  Expresses verbal understanding and consents to current therapy plan and treatment regiment.  Return in about 4 weeks (around 09/09/2016) for Follow up for pulmonary function testing/ eval of lungs near future. .  Please see AVS handed out to patient at the end of our visit for additional patient instructions/ counseling done pertaining to today's office visit.  Note: This document was prepared using Dragon voice recognition software and may include unintentional dictation errors.    Subjective:    CC: URI symptoms  HPI:  Pt presents with URI sx for 5-6 days.      C/o rhinorrhea, ST, and cough.     Denies objective  F/C, No face pain or ear pain, No N/V/D, + SOB, + occ wh,  NO DIB,  No Rash.    Has tried Mucinex, Nyquil for sx.    Overall not improving, not worsening.   Congestion in lungs- uses albuterol 2-4 times per week. Last PFT's done in 2008 and 2009.     2) Mood\anxiety:   prozac didn't agree with him.   Uses xanax only prn-  Gave script on 6/6 for #30 tab of the 0.25mg .  ONly uses when he gets panic attacks   3) tob-  he is cutting back and not ready to quit---> was at 2 ppd, now 1ppd or little less.  He is proud with himself    Patient Active Problem List   Diagnosis Date Noted  . Tobacco use disorder 08/12/2016  . Candidiasis of mouth 05/05/2016  . Edema tongue 05/05/2016  . BPH (benign prostatic hyperplasia) 04/23/2016  . GERD (gastroesophageal reflux disease) 04/23/2016  . OSA on CPAP 04/23/2016  . Tobacco abuse 04/23/2016  . Tobacco abuse counseling 04/23/2016  . Obesity 04/23/2016  . 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 Alcoholism in remission ([place]) 04/23/2016  . GAD (generalized anxiety disorder) 04/22/2016  . Panic attack 04/22/2016  . Alcoholism ([place]) 05/05/1981    Past medical history, Surgical history, Family history reviewed and noted below, Social history, Allergies, and Medications have been entered into the medical record, reviewed and changed as needed.   Allergies  Allergen Reactions  . Hydrocodone Nausea Only    Review of Systems  Constitutional: Negative.  Negative for chills, diaphoresis, fever, malaise/fatigue and weight loss.  HENT: Positive for congestion. Negative for sore throat and tinnitus.   Eyes: Negative.  Negative for blurred vision, double vision and photophobia.  Respiratory: Positive for cough, sputum production, shortness of breath and wheezing.   Cardiovascular: Negative.  Negative for chest pain and palpitations.  Gastrointestinal: Negative.  Negative for blood in stool, diarrhea, nausea and vomiting.  Genitourinary: Negative.  Negative  for dysuria, frequency and urgency.  Musculoskeletal: Negative.  Negative for joint pain and myalgias.  Skin: Negative.  Negative for itching and rash.  Neurological: Negative.  Negative for dizziness, focal weakness, weakness and headaches.  Endo/Heme/Allergies: Negative.  Negative for environmental allergies and polydipsia. Does not bruise/bleed easily.  Psychiatric/Behavioral: Negative.  Negative for depression and memory loss. The patient is not nervous/anxious  and does not have insomnia.     Objective:   Blood pressure 107/67, pulse 66, temperature 98.1 F (36.7 C), temperature source Oral, height 5' 11.25" (1.81 m), weight 230 lb 8 oz (104.6 kg). Body mass index is 31.92 kg/m.  General: Well Developed, well nourished, appropriate for stated age.  Neuro: Alert and oriented x3, extra-ocular muscles intact, sensation grossly intact.  HEENT: Normocephalic, atraumatic, pupils equal round reactive to light, neck supple, no masses, no painful lymphadenopathy, TM's intact B/L, no acute findings. Nares- patent, clear d/c, OP- clear, mild erythema, No TTP sinuses Skin: Warm and dry, no gross rash. Cardiac: RRR, S1 S2,  no murmurs rubs or gallops.  Respiratory: ECTA B/L and A/P, Not using accessory muscles, speaking in full sentences- unlabored. Vascular:  No gross lower ext edema, cap RF less 2 sec. Psych: No HI/SI, judgement and insight good, Euthymic mood. Full Affect.

## 2016-08-22 ENCOUNTER — Telehealth: Payer: Self-pay

## 2016-08-22 NOTE — Telephone Encounter (Signed)
Pt called stating that he continues to have URI type symptoms and feels worse since his OV on 08/12/2016.  Pt states that he is out of town and request RX for antibiotics sent to a local pharmacy.  Advised pt that he needs to be seen at a local urgent care to evaluate for viral vs bacterial infection, possible pneumonia, wheezing, etc.  Pt expressed understanding and is agreeable.  Charyl Bigger, CMA

## 2016-09-18 ENCOUNTER — Other Ambulatory Visit: Payer: Self-pay | Admitting: Family Medicine

## 2016-11-28 ENCOUNTER — Other Ambulatory Visit: Payer: Self-pay | Admitting: Family Medicine

## 2017-01-26 ENCOUNTER — Other Ambulatory Visit: Payer: Self-pay | Admitting: Family Medicine

## 2017-03-02 ENCOUNTER — Encounter: Payer: Self-pay | Admitting: Adult Health

## 2017-03-02 ENCOUNTER — Ambulatory Visit (INDEPENDENT_AMBULATORY_CARE_PROVIDER_SITE_OTHER): Payer: 59 | Admitting: Adult Health

## 2017-03-02 VITALS — BP 97/59 | HR 73 | Ht 71.25 in | Wt 239.8 lb

## 2017-03-02 DIAGNOSIS — R5383 Other fatigue: Secondary | ICD-10-CM | POA: Diagnosis not present

## 2017-03-02 DIAGNOSIS — R7303 Prediabetes: Secondary | ICD-10-CM

## 2017-03-02 DIAGNOSIS — N400 Enlarged prostate without lower urinary tract symptoms: Secondary | ICD-10-CM

## 2017-03-02 DIAGNOSIS — F1021 Alcohol dependence, in remission: Secondary | ICD-10-CM | POA: Diagnosis not present

## 2017-03-02 DIAGNOSIS — F411 Generalized anxiety disorder: Secondary | ICD-10-CM

## 2017-03-02 DIAGNOSIS — Z9989 Dependence on other enabling machines and devices: Secondary | ICD-10-CM | POA: Diagnosis not present

## 2017-03-02 DIAGNOSIS — E7889 Other lipoprotein metabolism disorders: Secondary | ICD-10-CM

## 2017-03-02 DIAGNOSIS — G4733 Obstructive sleep apnea (adult) (pediatric): Secondary | ICD-10-CM

## 2017-03-02 DIAGNOSIS — K219 Gastro-esophageal reflux disease without esophagitis: Secondary | ICD-10-CM

## 2017-03-02 MED ORDER — ALPRAZOLAM 0.25 MG PO TABS: ORAL_TABLET | ORAL | 0 refills | Status: DC

## 2017-03-02 MED ORDER — TAMSULOSIN HCL 0.4 MG PO CAPS: 0.4000 mg | ORAL_CAPSULE | Freq: Every day | ORAL | 2 refills | Status: DC

## 2017-03-02 MED ORDER — OMEPRAZOLE 20 MG PO CPDR: 20.0000 mg | DELAYED_RELEASE_CAPSULE | Freq: Every day | ORAL | 1 refills | Status: DC

## 2017-03-02 MED ORDER — ALBUTEROL SULFATE HFA 108 (90 BASE) MCG/ACT IN AERS: INHALATION_SPRAY | RESPIRATORY_TRACT | 1 refills | Status: DC

## 2017-03-02 NOTE — Assessment & Plan Note (Signed)
Continue nightly CPAP. QUIT SMOKING!

## 2017-03-02 NOTE — Patient Instructions (Addendum)
Tobacco Use Disorder Tobacco use disorder (TUD) is a mental disorder. It is the long-term use of tobacco in spite of related health problems or difficulty with normal life activities. Tobacco is most commonly smoked as cigarettes and less commonly as cigars or pipes. Smokeless chewing tobacco and snuff are also popular. People with TUD get a feeling of extreme pleasure (euphoria) from using tobacco and have a desire to use it again and again. Repeated use of tobacco can cause problems. The addictive effects of tobacco are due mainly tothe ingredient nicotine. Nicotine also causes a rush of adrenaline (epinephrine) in the body. This leads to increased blood pressure, heart rate, and breathing rate. These changes may cause problems for people with high blood pressure, weak hearts, or lung disease. High doses of nicotine in children and pets can lead to seizures and death. Tobacco contains a number of other unsafe chemicals. These chemicals are especially harmful when inhaled as smoke and can damage almost every organ in the body. Smokers live shorter lives than nonsmokers and are at risk of dying from a number of diseases and cancers. Tobacco smoke can also cause health problems for nonsmokers (due to inhaling secondhand smoke). Smoking is also a fire hazard. TUD usually starts in the late teenage years and is most common in young adults between the ages of 18 and 25 years. People who start smoking earlier in life are more likely to continue smoking as adults. TUD is somewhat more common in men than women. People with TUD are at higher risk for using alcohol and other drugs of abuse. What increases the risk? Risk factors for TUD include:  Having family members with the disorder.  Being around people who use tobacco.  Having an existing mental health issue such as schizophrenia, depression, bipolar disorder, ADHD, or posttraumatic stress disorder (PTSD). What are the signs or symptoms? People with  tobacco use disorder have two or more of the following signs and symptoms within 12 months:  Use of more tobacco over a longer period than intended.  Not able to cut down or control tobacco use.  A lot of time spent obtaining or using tobacco.  Strong desire or urge to use tobacco (craving). Cravings may last for 6 months or longer after quitting.  Use of tobacco even when use leads to major problems at work, school, or home.  Use of tobacco even when use leads to relationship problems.  Giving up or cutting down on important life activities because of tobacco use.  Repeatedly using tobacco in situations where it puts you or others in physical danger, like smoking in bed.  Use of tobacco even when it is known that a physical or mental problem is likely related to tobacco use.  Physical problems are numerous and may include chronic bronchitis, emphysema, lung and other cancers, gum disease, high blood pressure, heart disease, and stroke.  Mental problems caused by tobacco may include difficulty sleeping and anxiety.  Need to use greater amounts of tobacco to get the same effect. This means you have developed a tolerance.  Withdrawal symptoms as a result of stopping or rapidly cutting back use. These symptoms may last a month or more after quitting and include the following:  Depressed, anxious, or irritable mood.  Difficulty concentrating.  Increased appetite.  Restlessness or trouble sleeping.  Use of tobacco to avoid withdrawal symptoms. How is this diagnosed? Tobacco use disorder is diagnosed by your health care provider. A diagnosis may be made by:  Your   health care provider asking questions about your tobacco use and any problems it may be causing.  A physical exam.  Lab tests.  You may be referred to a mental health professional or addiction specialist. The severity of tobacco use disorder depends on the number of signs and symptoms you have:  Mild-Two or three  symptoms.  Moderate-Four or five symptoms.  Severe-Six or more symptoms. How is this treated? Many people with tobacco use disorder are unable to quit on their own and need help. Treatment options include the following:  Nicotine replacement therapy (NRT). NRT provides nicotine without the other harmful chemicals in tobacco. NRT gradually lowers the dosage of nicotine in the body and reduces withdrawal symptoms. NRT is available in over-the-counter forms (gum, lozenges, and skin patches) as well as prescription forms (mouth inhaler and nasal spray).  Medicines.This may include:  Antidepressant medicine that may reduce nicotine cravings.  A medicine that acts on nicotine receptors in the brain to reduce cravings and withdrawal symptoms. It may also block the effects of tobacco in people with TUD who relapse.  Counseling or talk therapy. A form of talk therapy called behavioral therapy is commonly used to treat people with TUD. Behavioral therapy looks at triggers for tobacco use, how to avoid them, and how to cope with cravings. It is most effective in person or by phone but is also available in self-help forms (books and Internet websites).  Support groups. These provide emotional support, advice, and guidance for quitting tobacco. The most effective treatment for TUD is usually a combination of medicine, talk therapy, and support groups. Follow these instructions at home:  Keep all follow-up visits as directed by your health care provider. This is important.  Take medicines only as directed by your health care provider.  Check with your health care provider before starting new prescription or over-the-counter medicines. Contact a health care provider if:  You are not able to take your medicines as prescribed.  Treatment is not helping your TUD and your symptoms get worse. Get help right away if:  You have serious thoughts about hurting yourself or others.  You have trouble  breathing, chest pain, sudden weakness, or sudden numbness in part of your body. This information is not intended to replace advice given to you by your health care provider. Make sure you discuss any questions you have with your health care provider. Document Released: 07/09/2004 Document Revised: 07/06/2016 Document Reviewed: 12/30/2013 Elsevier Interactive Patient Education  2017 [employer]. Generalized Anxiety Disorder, Adult Generalized anxiety disorder (GAD) is a mental health disorder. People with this condition constantly worry about everyday events. Unlike normal anxiety, worry related to GAD is not triggered by a specific event. These worries also do not fade or get better with time. GAD interferes with life functions, including relationships, work, and school. GAD can vary from mild to severe. People with severe GAD can have intense waves of anxiety with physical symptoms (panic attacks). What are the causes? The exact cause of GAD is not known. What increases the risk? This condition is more likely to develop in:  Women.  People who have a family history of anxiety disorders.  People who are very shy.  People who experience very stressful life events, such as the death of a loved one.  People who have a very stressful family environment. What are the signs or symptoms? People with GAD often worry excessively about many things in their lives, such as their health and family. They may  also be overly concerned about:  Doing well at work.  Being on time.  Natural disasters.  Friendships. Physical symptoms of GAD include:  Fatigue.  Muscle tension or having muscle twitches.  Trembling or feeling shaky.  Being easily startled.  Feeling like your heart is pounding or racing.  Feeling out of breath or like you cannot take a deep breath.  Having trouble falling asleep or staying asleep.  Sweating.  Nausea, diarrhea, or irritable bowel syndrome  (IBS).  Headaches.  Trouble concentrating or remembering facts.  Restlessness.  Irritability. How is this diagnosed? Your health care provider can diagnose GAD based on your symptoms and medical history. You will also have a physical exam. The health care provider will ask specific questions about your symptoms, including how severe they are, when they started, and if they come and go. Your health care provider may ask you about your use of alcohol or drugs, including prescription medicines. Your health care provider may refer you to a mental health specialist for further evaluation. Your health care provider will do a thorough examination and may perform additional tests to rule out other possible causes of your symptoms. To be diagnosed with GAD, a person must have anxiety that:  Is out of his or her control.  Affects several different aspects of his or her life, such as work and relationships.  Causes distress that makes him or her unable to take part in normal activities.  Includes at least three physical symptoms of GAD, such as restlessness, fatigue, trouble concentrating, irritability, muscle tension, or sleep problems. Before your health care provider can confirm a diagnosis of GAD, these symptoms must be present more days than they are not, and they must last for six months or longer. How is this treated? The following therapies are usually used to treat GAD:  Medicine. Antidepressant medicine is usually prescribed for long-term daily control. Antianxiety medicines may be added in severe cases, especially when panic attacks occur.  Talk therapy (psychotherapy). Certain types of talk therapy can be helpful in treating GAD by providing support, education, and guidance. Options include:  Cognitive behavioral therapy (CBT). People learn coping skills and techniques to ease their anxiety. They learn to identify unrealistic or negative thoughts and behaviors and to replace them with  positive ones.  Acceptance and commitment therapy (ACT). This treatment teaches people how to be mindful as a way to cope with unwanted thoughts and feelings.  Biofeedback. This process trains you to manage your body's response (physiological response) through breathing techniques and relaxation methods. You will work with a therapist while machines are used to monitor your physical symptoms.  Stress management techniques. These include yoga, meditation, and exercise. A mental health specialist can help determine which treatment is best for you. Some people see improvement with one type of therapy. However, other people require a combination of therapies. Follow these instructions at home:  Take over-the-counter and prescription medicines only as told by your health care provider.  Try to maintain a normal routine.  Try to anticipate stressful situations and allow extra time to manage them.  Practice any stress management or self-calming techniques as taught by your health care provider.  Do not punish yourself for setbacks or for not making progress.  Try to recognize your accomplishments, even if they are small.  Keep all follow-up visits as told by your health care provider. This is important. Contact a health care provider if:  Your symptoms do not get better.  Your symptoms  get worse.  You have signs of depression, such as:  A persistently sad, cranky, or irritable mood.  Loss of enjoyment in activities that used to bring you joy.  Change in weight or eating.  Changes in sleeping habits.  Avoiding friends or family members.  Loss of energy for normal tasks.  Feelings of guilt or worthlessness. Get help right away if:  You have serious thoughts about hurting yourself or others. If you ever feel like you may hurt yourself or others, or have thoughts about taking your own life, get help right away. You can go to your nearest emergency department or call:  Your local  emergency services (911 in the [place]  A suicide crisis helpline, such as the [facility] at 228-046-5009. This is open 24 hours a day. Summary  Generalized anxiety disorder (GAD) is a mental health disorder that involves worry that is not triggered by a specific event.  People with GAD often worry excessively about many things in their lives, such as their health and family.  GAD may cause physical symptoms such as restlessness, trouble concentrating, sleep problems, frequent sweating, nausea, diarrhea, headaches, and trembling or muscle twitching.  A mental health specialist can help determine which treatment is best for you. Some people see improvement with one type of therapy. However, other people require a combination of therapies. This information is not intended to replace advice given to you by your health care provider. Make sure you discuss any questions you have with your health care provider. Document Released: 02/28/2013 Document Revised: 09/23/2016 Document Reviewed: 09/23/2016 Elsevier Interactive Patient Education  2017 [employer].  Continue medications as directed. Continue to reduce tobacco use. We will call when lab results are available. Follow-up in 6 months, sooner if needed.

## 2017-03-02 NOTE — Assessment & Plan Note (Signed)
Continue daily Omeprazole. Avoid spicy/acidic foods. Do not eat close to bedtime.

## 2017-03-02 NOTE — Assessment & Plan Note (Addendum)
Continue Tamsulosin 0.4 daily. He reports sx's began after getting kicked by a horse.

## 2017-03-02 NOTE — Progress Notes (Signed)
Subjective:    Patient ID: Billy Thomas, male    DOB: 01/20/1966, 51 y.o.   MRN: [id number]  HPI:  [name] is here for f/u on GAD, GERD, BPH, and OSA.  He reports medication compliance, denies SE.  He smokes 1/2 pack to pack a day and is "thinking about quitting smoking".  He reports using albuterol inhalers almost daily and know that tobacco use causes SOB. Last use of ETOH was 2008.  He estimates to use PRN alprazolam 1-2 times/week and denies sedation.  He is fasting today and would like to complete labs this morning.   Of Note:  Hx of SBP 90-110s, DBP 50-70s, denies dizziness, orthostatis hypotension.  Patient Care Team    Relationship Specialty Notifications Start End  Mellody Dance, DO PCP - General Family Medicine  04/22/16     Patient Active Problem List   Diagnosis Date Noted  . Tobacco use disorder 08/12/2016  . Candidiasis of mouth 05/05/2016  . Edema tongue 05/05/2016  . BPH (benign prostatic hyperplasia) 04/23/2016  . GERD (gastroesophageal reflux disease) 04/23/2016  . OSA on CPAP 04/23/2016  . Tobacco abuse 04/23/2016  . Tobacco abuse counseling 04/23/2016  . Obesity 04/23/2016  . 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 Alcoholism in remission ([place]) 04/23/2016  . GAD (generalized anxiety disorder) 04/22/2016  . Panic attack 04/22/2016  . Alcoholism ([place]) 05/05/1981     Past Medical History:  Diagnosis Date  . Alcoholism ([place])   . Alcoholism ([place])    recovering  . Kidney stone 2010     Past Surgical History:  Procedure Laterality Date  . APPENDECTOMY    . CARDIAC CATHETERIZATION  2010  . SHOULDER SURGERY Bilateral 10 years ago     Family History  Problem Relation Age of Onset  . Cancer Father     lung  . Heart attack Paternal Grandfather   . Heart disease Paternal Grandfather      History  Drug Use No     History  Alcohol Use No     History  Smoking Status  . Current Every Day Smoker  . Packs/day: 1.00  . Years: 32.00   Smokeless Tobacco  . Never Used     Outpatient Encounter Prescriptions as of 03/02/2017  Medication Sig  . albuterol (VENTOLIN HFA) 108 (90 Base) MCG/ACT inhaler INHALE 2 PUFFS 2 TIMES A DAY AS NEEDED.  Marland Kitchen ALPRAZolam (XANAX) 0.25 MG tablet TAKE 1 TABLET BY MOUTH ONLY AS NEEDED FOR PANIC ATTACKS  . omeprazole (PRILOSEC) 20 MG capsule Take 1 capsule (20 mg total) by mouth daily.  . tamsulosin (FLOMAX) 0.4 MG CAPS capsule TAKE 1 CAPSULE BY MOUTH DAILY.  . [DISCONTINUED] azithromycin (ZITHROMAX Z-PAK) 250 MG tablet As written  . [DISCONTINUED] predniSONE (DELTASONE) 20 MG tablet Take 3 pills a day for 2 days, 2 pills a day for 2 days, 1 pill a day for 2 days then one half pill a day for 2 days then off   No facility-administered encounter medications on file as of 03/02/2017.     Allergies: Hydrocodone  Body mass index is 33.21 kg/m.  Blood pressure (!) 97/59, pulse 73, height 5' 11.25" (1.81 m), weight 239 lb 12.8 oz (108.8 kg).     Review of Systems  Constitutional: Negative for activity change, appetite change, chills, diaphoresis, fatigue, fever and unexpected weight change.  HENT: Negative for congestion.   Eyes: Negative for visual disturbance.  Respiratory: Positive for shortness of breath and wheezing. Negative for  cough, chest tightness and stridor.   Cardiovascular: Negative for chest pain, palpitations and leg swelling.  Endocrine: Negative for cold intolerance, heat intolerance, polydipsia, polyphagia and polyuria.  Genitourinary: Negative for difficulty urinating and frequency.  Skin: Negative for color change, pallor, rash and wound.  Allergic/Immunologic: Negative for immunocompromised state.  Neurological: Negative for dizziness, tremors, weakness, light-headedness and headaches.  Hematological: Does not bruise/bleed easily.  Psychiatric/Behavioral: Negative for agitation, dysphoric mood, self-injury, sleep disturbance and suicidal ideas. The patient is  nervous/anxious.        Objective:   Physical Exam  Constitutional: He is oriented to person, place, and time. He appears well-developed and well-nourished. No distress.  HENT:  Head: Normocephalic and atraumatic.  Eyes: Conjunctivae are normal. Pupils are equal, round, and reactive to light.  Neck: Normal range of motion.  Cardiovascular: Normal rate, regular rhythm, normal heart sounds and intact distal pulses.   No murmur heard. Pulmonary/Chest: Effort normal. No respiratory distress. He has wheezes in the left upper field and the left middle field. He has no rales. He exhibits no tenderness.  Lymphadenopathy:    He has no cervical adenopathy.  Neurological: He is alert and oriented to person, place, and time.  Skin: Skin is warm and dry. No rash noted. He is not diaphoretic. No erythema. No pallor.  Psychiatric: He has a normal mood and affect. His behavior is normal. Judgment and thought content normal.  Nursing note and vitals reviewed.         Assessment & Plan:   1. Alcoholism in remission ([place])   2. Lipids abnormal   3. Prediabetes   4. Other fatigue   5. OSA on CPAP   6. Gastroesophageal reflux disease without esophagitis   7. Benign prostatic hyperplasia, unspecified whether lower urinary tract symptoms present   8. GAD (generalized anxiety disorder)     OSA on CPAP Continue nightly CPAP. QUIT SMOKING!  GERD (gastroesophageal reflux disease) Continue daily Omeprazole. Avoid spicy/acidic foods. Do not eat close to bedtime.  BPH (benign prostatic hyperplasia) Continue Tamsulosin 0.4 daily. He reports sx's began after getting kicked by a horse.  GAD (generalized anxiety disorder) Alprazolam 0.25mg   30 tabs No refill Dr. Raliegh Scarlet will determine if another refill is appropriate-since they have discussed that alprazolam is not a long term fix for intermittent anxiety.    FOLLOW-UP:  Return in about 6 months (around 09/01/2017) for Regular Follow Up.

## 2017-03-02 NOTE — Assessment & Plan Note (Signed)
Alprazolam 0.25mg   30 tabs No refill Dr. Raliegh Scarlet will determine if another refill is appropriate-since they have discussed that alprazolam is not a long term fix for intermittent anxiety.

## 2017-03-03 LAB — HEMOGLOBIN A1C
ESTIMATED AVERAGE GLUCOSE: 126 mg/dL
Hgb A1c MFr Bld: 6 % — ABNORMAL HIGH (ref 4.8–5.6)

## 2017-03-03 LAB — LIPID PANEL
CHOLESTEROL TOTAL: 170 mg/dL (ref 100–199)
Chol/HDL Ratio: 3.6 ratio (ref 0.0–5.0)
HDL: 47 mg/dL (ref >39)
LDL CALC: 108 mg/dL — AB (ref 0–99)
Triglycerides: 75 mg/dL (ref 0–149)
VLDL CHOLESTEROL CAL: 15 mg/dL (ref 5–40)

## 2017-03-03 LAB — CBC WITH DIFFERENTIAL/PLATELET
BASOS: 0 %
Basophils Absolute: 0 10*3/uL (ref 0.0–0.2)
EOS (ABSOLUTE): 0.1 10*3/uL (ref 0.0–0.4)
EOS: 2 %
HEMATOCRIT: 39.3 % (ref 37.5–51.0)
Hemoglobin: 13 g/dL (ref 13.0–17.7)
Immature Grans (Abs): 0 10*3/uL (ref 0.0–0.1)
Immature Granulocytes: 0 %
LYMPHS ABS: 1.7 10*3/uL (ref 0.7–3.1)
Lymphs: 36 %
MCH: 30.8 pg (ref 26.6–33.0)
MCHC: 33.1 g/dL (ref 31.5–35.7)
MCV: 93 fL (ref 79–97)
MONOS ABS: 0.3 10*3/uL (ref 0.1–0.9)
Monocytes: 7 %
NEUTROS ABS: 2.6 10*3/uL (ref 1.4–7.0)
NEUTROS PCT: 55 %
PLATELETS: 222 10*3/uL (ref 150–379)
RBC: 4.22 x10E6/uL (ref 4.14–5.80)
RDW: 13.9 % (ref 12.3–15.4)
WBC: 4.8 10*3/uL (ref 3.4–10.8)

## 2017-03-03 LAB — COMPREHENSIVE METABOLIC PANEL
A/G RATIO: 2.1 (ref 1.2–2.2)
ALT: 17 IU/L (ref 0–44)
AST: 16 IU/L (ref 0–40)
Albumin: 4.4 g/dL (ref 3.5–5.5)
Alkaline Phosphatase: 75 IU/L (ref 39–117)
BILIRUBIN TOTAL: 0.3 mg/dL (ref 0.0–1.2)
BUN/Creatinine Ratio: 20 (ref 9–20)
BUN: 17 mg/dL (ref 6–24)
CALCIUM: 9.2 mg/dL (ref 8.7–10.2)
CHLORIDE: 104 mmol/L (ref 96–106)
CO2: 27 mmol/L (ref 18–29)
Creatinine, Ser: 0.84 mg/dL (ref 0.76–1.27)
GFR calc Af Amer: 119 mL/min/{1.73_m2} (ref >59)
GFR calc non Af Amer: 103 mL/min/{1.73_m2} (ref >59)
GLOBULIN, TOTAL: 2.1 g/dL (ref 1.5–4.5)
Glucose: 108 mg/dL — ABNORMAL HIGH (ref 65–99)
POTASSIUM: 4.9 mmol/L (ref 3.5–5.2)
SODIUM: 144 mmol/L (ref 134–144)
Total Protein: 6.5 g/dL (ref 6.0–8.5)

## 2017-03-03 LAB — VITAMIN D 25 HYDROXY (VIT D DEFICIENCY, FRACTURES): VIT D 25 HYDROXY: 38.6 ng/mL (ref 30.0–100.0)

## 2017-03-03 LAB — TSH: TSH: 1.08 u[IU]/mL (ref 0.450–4.500)

## 2017-05-04 ENCOUNTER — Other Ambulatory Visit: Payer: Self-pay | Admitting: Adult Health

## 2017-05-04 NOTE — Telephone Encounter (Signed)
Please call pt and share that per his last OV 02/2017, we discussed the following: "GAD (generalized anxiety disorder) Alprazolam 0.25mg   30 tabs No refill Dr. Raliegh Scarlet will determine if another refill is appropriate-since they have discussed that alprazolam is not a long term fix for intermittent anxiety.  He needs to make appt with Dr. Raliegh Scarlet to discuss treatment of anxiety.  Thanks! Valetta Fuller

## 2017-07-21 ENCOUNTER — Ambulatory Visit (INDEPENDENT_AMBULATORY_CARE_PROVIDER_SITE_OTHER): Payer: 59 | Admitting: Adult Health

## 2017-07-21 ENCOUNTER — Encounter: Payer: Self-pay | Admitting: Adult Health

## 2017-07-21 VITALS — BP 119/74 | HR 75 | Ht 71.25 in | Wt 232.0 lb

## 2017-07-21 DIAGNOSIS — Z0000 Encounter for general adult medical examination without abnormal findings: Secondary | ICD-10-CM | POA: Diagnosis not present

## 2017-07-21 DIAGNOSIS — G4733 Obstructive sleep apnea (adult) (pediatric): Secondary | ICD-10-CM | POA: Diagnosis not present

## 2017-07-21 DIAGNOSIS — F411 Generalized anxiety disorder: Secondary | ICD-10-CM | POA: Diagnosis not present

## 2017-07-21 DIAGNOSIS — Z23 Encounter for immunization: Secondary | ICD-10-CM

## 2017-07-21 DIAGNOSIS — G4730 Sleep apnea, unspecified: Secondary | ICD-10-CM

## 2017-07-21 DIAGNOSIS — F17200 Nicotine dependence, unspecified, uncomplicated: Secondary | ICD-10-CM

## 2017-07-21 DIAGNOSIS — Z1211 Encounter for screening for malignant neoplasm of colon: Secondary | ICD-10-CM | POA: Insufficient documentation

## 2017-07-21 DIAGNOSIS — Z9989 Dependence on other enabling machines and devices: Secondary | ICD-10-CM

## 2017-07-21 MED ORDER — TAMSULOSIN HCL 0.4 MG PO CAPS: 0.4000 mg | ORAL_CAPSULE | Freq: Every day | ORAL | 2 refills | Status: DC

## 2017-07-21 MED ORDER — ALBUTEROL SULFATE HFA 108 (90 BASE) MCG/ACT IN AERS: INHALATION_SPRAY | RESPIRATORY_TRACT | 3 refills | Status: DC

## 2017-07-21 NOTE — Progress Notes (Signed)
Subjective:    Patient ID: Billy Thomas, male    DOB: [birth year], 51 y.o.   MRN: [id number]  HPI:  Billy Thomas is here for f/u:  GAD, BPH, tobacco abuse and rx for new CPAP. He is compliant on all medications and denies SE.  He uses inhalers multiple times daily and reports that the hot/humid weather exacerbates his wheezing.  He has been using CPAP > 12 years and requests rx for updated machine.  He continue to use tobacco, est pack/day, however he has been tapering back-great! He reports using PRN Alprazolam 0.25mg  sparingly, he was asked several times to estimate how many tablets a week he uses and he only responded "it just depends on how the week is going and how stressed I am at work".  He denies EOTH use or addiction issues.  Last use if EOTH was 2008. He denies CP/dyspnea at rest/palpitations.  He denies thoughts of harming himself/others.  Patient Care Team    Relationship Specialty Notifications Start End  Mellody Dance, DO PCP - General Family Medicine  04/22/16     Patient Active Problem List   Diagnosis Date Noted  . Healthcare maintenance 07/21/2017  . Tobacco use disorder 08/12/2016  . Candidiasis of mouth 05/05/2016  . Edema tongue 05/05/2016  . BPH (benign prostatic hyperplasia) 04/23/2016  . GERD (gastroesophageal reflux disease) 04/23/2016  . OSA on CPAP 04/23/2016  . Tobacco abuse 04/23/2016  . Tobacco abuse counseling 04/23/2016  . Obesity 04/23/2016  . 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 Alcoholism in remission ([place]) 04/23/2016  . GAD (generalized anxiety disorder) 04/22/2016  . Panic attack 04/22/2016  . Alcoholism ([place]) 05/05/1981     Past Medical History:  Diagnosis Date  . Alcoholism ([place])   . Alcoholism ([place])    recovering  . Kidney stone 2010     Past Surgical History:  Procedure Laterality Date  . APPENDECTOMY    . CARDIAC CATHETERIZATION  2010  . SHOULDER SURGERY Bilateral 10 years ago     Family History  Problem Relation Age of  Onset  . Cancer Father        lung  . Heart attack Paternal Grandfather   . Heart disease Paternal Grandfather      History  Drug Use No     History  Alcohol Use No     History  Smoking Status  . Current Every Day Smoker  . Packs/day: 1.00  . Years: 32.00  Smokeless Tobacco  . Never Used     Outpatient Encounter Prescriptions as of 07/21/2017  Medication Sig  . albuterol (VENTOLIN HFA) 108 (90 Base) MCG/ACT inhaler INHALE 2 PUFFS 2 TIMES A DAY AS NEEDED.  Marland Kitchen ALPRAZolam (XANAX) 0.25 MG tablet TAKE 1 TABLET BY MOUTH ONLY AS NEEDED FOR PANIC ATTACKS  . tamsulosin (FLOMAX) 0.4 MG CAPS capsule Take 1 capsule (0.4 mg total) by mouth daily.  . [DISCONTINUED] albuterol (VENTOLIN HFA) 108 (90 Base) MCG/ACT inhaler INHALE 2 PUFFS 2 TIMES A DAY AS NEEDED.  . [DISCONTINUED] omeprazole (PRILOSEC) 20 MG capsule Take 1 capsule (20 mg total) by mouth daily.  . [DISCONTINUED] tamsulosin (FLOMAX) 0.4 MG CAPS capsule Take 1 capsule (0.4 mg total) by mouth daily.   No facility-administered encounter medications on file as of 07/21/2017.     Allergies: Hydrocodone  Body mass index is 32.13 kg/m.  Blood pressure 119/74, pulse 75, height 5' 11.25" (1.81 m), weight 232 lb (105.2 kg).      Review of Systems  Constitutional: Positive for fatigue. Negative for activity change, appetite change, chills, diaphoresis, fever and unexpected weight change.  Eyes: Negative for visual disturbance.  Respiratory: Positive for cough, shortness of breath and wheezing. Negative for chest tightness and stridor.   Cardiovascular: Negative for chest pain, palpitations and leg swelling.  Gastrointestinal: Negative for abdominal distention, abdominal pain, blood in stool, constipation, diarrhea, nausea and vomiting.  Endocrine: Negative for cold intolerance, heat intolerance, polydipsia, polyphagia and polyuria.  Genitourinary: Negative for difficulty urinating, flank pain and frequency.  Skin: Negative for  color change, pallor, rash and wound.  Neurological: Negative for dizziness, tremors, weakness and headaches.  Hematological: Does not bruise/bleed easily.  Psychiatric/Behavioral: Negative for confusion, decreased concentration, dysphoric mood, hallucinations, self-injury, sleep disturbance and suicidal ideas. The patient is nervous/anxious. The patient is not hyperactive.        Objective:   Physical Exam  Constitutional: He is oriented to person, place, and time. He appears well-developed and well-nourished. No distress.  HENT:  Head: Normocephalic and atraumatic.  Right Ear: External ear normal.  Eyes: Pupils are equal, round, and reactive to light. Conjunctivae are normal.  Cardiovascular: Normal rate, regular rhythm and normal heart sounds.   Pulmonary/Chest: Effort normal. No respiratory distress. He has no decreased breath sounds. He has wheezes in the right middle field and the right lower field. He has no rhonchi. He has no rales. He exhibits no tenderness.  Neurological: He is alert and oriented to person, place, and time. Coordination normal.  Skin: Skin is warm and dry. No rash noted. He is not diaphoretic. No erythema. No pallor.  Psychiatric: He has a normal mood and affect. His behavior is normal. Judgment and thought content normal.  Nursing note and vitals reviewed.         Assessment & Plan:   1. Sleep apnea, unspecified type   2. OSA on CPAP   3. GAD (generalized anxiety disorder)   4. Tobacco use disorder   5. Healthcare maintenance     OSA on CPAP New CPAP order provided. Continue to reduce-stop tobacco use.  GAD (generalized anxiety disorder) Try guided imagery, breathing techniques to reduce anxiety levels. He reports trying/failing several SSRIs to treat anxiety and he feels that low dose PRN Alprazolam is the "only one" that works. He was seen 02/2017 and provided Alprazolam 0.25mg , 30 tabs No refill-since Dr. Raliegh Scarlet felt that long term use is not  the best means to treat GAD. He and I discussed this and I will confer with Dr. Raliegh Scarlet tomorrow, and if she feels that a refill is appropriate it will be refilled. [place] Controlled Substance Database verified: 11/28/16 Alprazolam 0.25mg  30 tabs 03/03/17 Alprazolam 0.25mg  30 tabs     Tobacco use disorder Continue to reduce-stop tobacco use! Declined medication to aid with cessation.  Healthcare maintenance Advised to schedule CPE this fall with PCP.    FOLLOW-UP:  Return in about 3 months (around 10/20/2017) for CPE.

## 2017-07-21 NOTE — Assessment & Plan Note (Addendum)
Try guided imagery, breathing techniques to reduce anxiety levels. He reports trying/failing several SSRIs to treat anxiety and he feels that low dose PRN Alprazolam is the "only one" that works. He was seen 02/2017 and provided Alprazolam 0.25mg , 30 tabs No refill-since Dr. Raliegh Scarlet felt that long term use is not the best means to treat GAD. He and I discussed this and I will confer with Dr. Raliegh Scarlet tomorrow, and if she feels that a refill is appropriate it will be refilled. [place] Controlled Substance Database verified: 11/28/16 Alprazolam 0.25mg  30 tabs 03/03/17 Alprazolam 0.25mg  30 tabs

## 2017-07-21 NOTE — Assessment & Plan Note (Signed)
Continue to reduce-stop tobacco use! Declined medication to aid with cessation.

## 2017-07-21 NOTE — Assessment & Plan Note (Addendum)
Advised to schedule CPE this fall with PCP.

## 2017-07-21 NOTE — Patient Instructions (Addendum)
Sleep Apnea Sleep apnea is a condition in which breathing pauses or becomes shallow during sleep. Episodes of sleep apnea usually last 10 seconds or longer, and they may occur as many as 20 times an hour. Sleep apnea disrupts your sleep and keeps your body from getting the rest that it needs. This condition can increase your risk of certain health problems, including:  Heart attack.  Stroke.  Obesity.  Diabetes.  Heart failure.  Irregular heartbeat.  There are three kinds of sleep apnea:  Obstructive sleep apnea. This kind is caused by a blocked or collapsed airway.  Central sleep apnea. This kind happens when the part of the brain that controls breathing does not send the correct signals to the muscles that control breathing.  Mixed sleep apnea. This is a combination of obstructive and central sleep apnea.  What are the causes? The most common cause of this condition is a collapsed or blocked airway. An airway can collapse or become blocked if:  Your throat muscles are abnormally relaxed.  Your tongue and tonsils are larger than normal.  You are overweight.  Your airway is smaller than normal.  What increases the risk? This condition is more likely to develop in people who:  Are overweight.  Smoke.  Have a smaller than normal airway.  Are elderly.  Are male.  Drink alcohol.  Take sedatives or tranquilizers.  Have a family history of sleep apnea.  What are the signs or symptoms? Symptoms of this condition include:  Trouble staying asleep.  Daytime sleepiness and tiredness.  Irritability.  Loud snoring.  Morning headaches.  Trouble concentrating.  Forgetfulness.  Decreased interest in sex.  Unexplained sleepiness.  Mood swings.  Personality changes.  Feelings of depression.  Waking up often during the night to urinate.  Dry mouth.  Sore throat.  How is this diagnosed? This condition may be diagnosed with:  A medical history.  A  physical exam.  A series of tests that are done while you are sleeping (sleep study). These tests are usually done in a sleep lab, but they may also be done at home.  How is this treated? Treatment for this condition aims to restore normal breathing and to ease symptoms during sleep. It may involve managing health issues that can affect breathing, such as high blood pressure or obesity. Treatment may include:  Sleeping on your side.  Using a decongestant if you have nasal congestion.  Avoiding the use of depressants, including alcohol, sedatives, and narcotics.  Losing weight if you are overweight.  Making changes to your diet.  Quitting smoking.  Using a device to open your airway while you sleep, such as: ? An oral appliance. This is a custom-made mouthpiece that shifts your lower jaw forward. ? A continuous positive airway pressure (CPAP) device. This device delivers oxygen to your airway through a mask. ? A nasal expiratory positive airway pressure (EPAP) device. This device has valves that you put into each nostril. ? A bi-level positive airway pressure (BPAP) device. This device delivers oxygen to your airway through a mask.  Surgery if other treatments do not work. During surgery, excess tissue is removed to create a wider airway.  It is important to get treatment for sleep apnea. Without treatment, this condition can lead to:  High blood pressure.  Coronary artery disease.  (Men) An inability to achieve or maintain an erection (impotence).  Reduced thinking abilities.  Follow these instructions at home:  Make any lifestyle changes that your health   care provider recommends.  Eat a healthy, well-balanced diet.  Take over-the-counter and prescription medicines only as told by your health care provider.  Avoid using depressants, including alcohol, sedatives, and narcotics.  Take steps to lose weight if you are overweight.  If you were given a device to open your  airway while you sleep, use it only as told by your health care provider.  Do not use any tobacco products, such as cigarettes, chewing tobacco, and e-cigarettes. If you need help quitting, ask your health care provider.  Keep all follow-up visits as told by your health care provider. This is important. Contact a health care provider if:  The device that you received to open your airway during sleep is uncomfortable or does not seem to be working.  Your symptoms do not improve.  Your symptoms get worse. Get help right away if:  You develop chest pain.  You develop shortness of breath.  You develop discomfort in your back, arms, or stomach.  You have trouble speaking.  You have weakness on one side of your body.  You have drooping in your face. These symptoms may represent a serious problem that is an emergency. Do not wait to see if the symptoms will go away. Get medical help right away. Call your local emergency services (911 in the [place] Do not drive yourself to the hospital. This information is not intended to replace advice given to you by your health care provider. Make sure you discuss any questions you have with your health care provider. Document Released: 10/24/2002 Document Revised: 06/29/2016 Document Reviewed: 08/13/2015 Elsevier Interactive Patient Education  2018 [employer].    Tobacco Use Disorder Tobacco use disorder (TUD) is a mental disorder. It is the long-term use of tobacco in spite of related health problems or difficulty with normal life activities. Tobacco is most commonly smoked as cigarettes and less commonly as cigars or pipes. Smokeless chewing tobacco and snuff are also popular. People with TUD get a feeling of extreme pleasure (euphoria) from using tobacco and have a desire to use it again and again. Repeated use of tobacco can cause problems. The addictive effects of tobacco are due mainly tothe ingredient nicotine. Nicotine also causes a rush of  adrenaline (epinephrine) in the body. This leads to increased blood pressure, heart rate, and breathing rate. These changes may cause problems for people with high blood pressure, weak hearts, or lung disease. High doses of nicotine in children and pets can lead to seizures and death. Tobacco contains a number of other unsafe chemicals. These chemicals are especially harmful when inhaled as smoke and can damage almost every organ in the body. Smokers live shorter lives than nonsmokers and are at risk of dying from a number of diseases and cancers. Tobacco smoke can also cause health problems for nonsmokers (due to inhaling secondhand smoke). Smoking is also a fire hazard. TUD usually starts in the late teenage years and is most common in young adults between the ages of 84 and 27 years. People who start smoking earlier in life are more likely to continue smoking as adults. TUD is somewhat more common in men than women. People with TUD are at higher risk for using alcohol and other drugs of abuse. What increases the risk? Risk factors for TUD include:  Having family members with the disorder.  Being around people who use tobacco.  Having an existing mental health issue such as schizophrenia, depression, bipolar disorder, ADHD, or posttraumatic stress disorder (PTSD).  What are the signs or symptoms? People with tobacco use disorder have two or more of the following signs and symptoms within 12 months:  Use of more tobacco over a longer period than intended.  Not able to cut down or control tobacco use.  A lot of time spent obtaining or using tobacco.  Strong desire or urge to use tobacco (craving). Cravings may last for 6 months or longer after quitting.  Use of tobacco even when use leads to major problems at work, school, or home.  Use of tobacco even when use leads to relationship problems.  Giving up or cutting down on important life activities because of tobacco use.  Repeatedly  using tobacco in situations where it puts you or others in physical danger, like smoking in bed.  Use of tobacco even when it is known that a physical or mental problem is likely related to tobacco use. ? Physical problems are numerous and may include chronic bronchitis, emphysema, lung and other cancers, gum disease, high blood pressure, heart disease, and stroke. ? Mental problems caused by tobacco may include difficulty sleeping and anxiety.  Need to use greater amounts of tobacco to get the same effect. This means you have developed a tolerance.  Withdrawal symptoms as a result of stopping or rapidly cutting back use. These symptoms may last a month or more after quitting and include the following: ? Depressed, anxious, or irritable mood. ? Difficulty concentrating. ? Increased appetite. ? Restlessness or trouble sleeping. ? Use of tobacco to avoid withdrawal symptoms.  How is this diagnosed? Tobacco use disorder is diagnosed by your health care provider. A diagnosis may be made by:  Your health care provider asking questions about your tobacco use and any problems it may be causing.  A physical exam.  Lab tests.  You may be referred to a mental health professional or addiction specialist.  The severity of tobacco use disorder depends on the number of signs and symptoms you have:  Mild-Two or three symptoms.  Moderate-Four or five symptoms.  Severe-Six or more symptoms.  How is this treated? Many people with tobacco use disorder are unable to quit on their own and need help. Treatment options include the following:  Nicotine replacement therapy (NRT). NRT provides nicotine without the other harmful chemicals in tobacco. NRT gradually lowers the dosage of nicotine in the body and reduces withdrawal symptoms. NRT is available in over-the-counter forms (gum, lozenges, and skin patches) as well as prescription forms (mouth inhaler and nasal spray).  Medicines.This may  include: ? Antidepressant medicine that may reduce nicotine cravings. ? A medicine that acts on nicotine receptors in the brain to reduce cravings and withdrawal symptoms. It may also block the effects of tobacco in people with TUD who relapse.  Counseling or talk therapy. A form of talk therapy called behavioral therapy is commonly used to treat people with TUD. Behavioral therapy looks at triggers for tobacco use, how to avoid them, and how to cope with cravings. It is most effective in person or by phone but is also available in self-help forms (books and Internet websites).  Support groups. These provide emotional support, advice, and guidance for quitting tobacco.  The most effective treatment for TUD is usually a combination of medicine, talk therapy, and support groups. Follow these instructions at home:  Keep all follow-up visits as directed by your health care provider. This is important.  Take medicines only as directed by your health care provider.  Check with your  health care provider before starting new prescription or over-the-counter medicines. Contact a health care provider if:  You are not able to take your medicines as prescribed.  Treatment is not helping your TUD and your symptoms get worse. Get help right away if:  You have serious thoughts about hurting yourself or others.  You have trouble breathing, chest pain, sudden weakness, or sudden numbness in part of your body. This information is not intended to replace advice given to you by your health care provider. Make sure you discuss any questions you have with your health care provider. Document Released: 07/09/2004 Document Revised: 07/06/2016 Document Reviewed: 12/30/2013 Elsevier Interactive Patient Education  2018 [employer].   Generalized Anxiety Disorder, Adult Generalized anxiety disorder (GAD) is a mental health disorder. People with this condition constantly worry about everyday events. Unlike normal  anxiety, worry related to GAD is not triggered by a specific event. These worries also do not fade or get better with time. GAD interferes with life functions, including relationships, work, and school. GAD can vary from mild to severe. People with severe GAD can have intense waves of anxiety with physical symptoms (panic attacks). What are the causes? The exact cause of GAD is not known. What increases the risk? This condition is more likely to develop in:  Women.  People who have a family history of anxiety disorders.  People who are very shy.  People who experience very stressful life events, such as the death of a loved one.  People who have a very stressful family environment.  What are the signs or symptoms? People with GAD often worry excessively about many things in their lives, such as their health and family. They may also be overly concerned about:  Doing well at work.  Being on time.  Natural disasters.  Friendships.  Physical symptoms of GAD include:  Fatigue.  Muscle tension or having muscle twitches.  Trembling or feeling shaky.  Being easily startled.  Feeling like your heart is pounding or racing.  Feeling out of breath or like you cannot take a deep breath.  Having trouble falling asleep or staying asleep.  Sweating.  Nausea, diarrhea, or irritable bowel syndrome (IBS).  Headaches.  Trouble concentrating or remembering facts.  Restlessness.  Irritability.  How is this diagnosed? Your health care provider can diagnose GAD based on your symptoms and medical history. You will also have a physical exam. The health care provider will ask specific questions about your symptoms, including how severe they are, when they started, and if they come and go. Your health care provider may ask you about your use of alcohol or drugs, including prescription medicines. Your health care provider may refer you to a mental health specialist for further  evaluation. Your health care provider will do a thorough examination and may perform additional tests to rule out other possible causes of your symptoms. To be diagnosed with GAD, a person must have anxiety that:  Is out of his or her control.  Affects several different aspects of his or her life, such as work and relationships.  Causes distress that makes him or her unable to take part in normal activities.  Includes at least three physical symptoms of GAD, such as restlessness, fatigue, trouble concentrating, irritability, muscle tension, or sleep problems.  Before your health care provider can confirm a diagnosis of GAD, these symptoms must be present more days than they are not, and they must last for six months or longer. How is this  treated? The following therapies are usually used to treat GAD:  Medicine. Antidepressant medicine is usually prescribed for long-term daily control. Antianxiety medicines may be added in severe cases, especially when panic attacks occur.  Talk therapy (psychotherapy). Certain types of talk therapy can be helpful in treating GAD by providing support, education, and guidance. Options include: ? Cognitive behavioral therapy (CBT). People learn coping skills and techniques to ease their anxiety. They learn to identify unrealistic or negative thoughts and behaviors and to replace them with positive ones. ? Acceptance and commitment therapy (ACT). This treatment teaches people how to be mindful as a way to cope with unwanted thoughts and feelings. ? Biofeedback. This process trains you to manage your body's response (physiological response) through breathing techniques and relaxation methods. You will work with a therapist while machines are used to monitor your physical symptoms.  Stress management techniques. These include yoga, meditation, and exercise.  A mental health specialist can help determine which treatment is best for you. Some people see improvement  with one type of therapy. However, other people require a combination of therapies. Follow these instructions at home:  Take over-the-counter and prescription medicines only as told by your health care provider.  Try to maintain a normal routine.  Try to anticipate stressful situations and allow extra time to manage them.  Practice any stress management or self-calming techniques as taught by your health care provider.  Do not punish yourself for setbacks or for not making progress.  Try to recognize your accomplishments, even if they are small.  Keep all follow-up visits as told by your health care provider. This is important. Contact a health care provider if:  Your symptoms do not get better.  Your symptoms get worse.  You have signs of depression, such as: ? A persistently sad, cranky, or irritable mood. ? Loss of enjoyment in activities that used to bring you joy. ? Change in weight or eating. ? Changes in sleeping habits. ? Avoiding friends or family members. ? Loss of energy for normal tasks. ? Feelings of guilt or worthlessness. Get help right away if:  You have serious thoughts about hurting yourself or others. If you ever feel like you may hurt yourself or others, or have thoughts about taking your own life, get help right away. You can go to your nearest emergency department or call:  Your local emergency services (911 in the [place]  A suicide crisis helpline, such as the [place] at (828)836-5700. This is open 24 hours a day.  Summary  Generalized anxiety disorder (GAD) is a mental health disorder that involves worry that is not triggered by a specific event.  People with GAD often worry excessively about many things in their lives, such as their health and family.  GAD may cause physical symptoms such as restlessness, trouble concentrating, sleep problems, frequent sweating, nausea, diarrhea, headaches, and trembling or muscle  twitching.  A mental health specialist can help determine which treatment is best for you. Some people see improvement with one type of therapy. However, other people require a combination of therapies. This information is not intended to replace advice given to you by your health care provider. Make sure you discuss any questions you have with your health care provider. Document Released: 02/28/2013 Document Revised: 09/23/2016 Document Reviewed: 09/23/2016 Elsevier Interactive Patient Education  2018 [employer].   Continue to reduce tobacco use- YOU CAN DO IT! Please take CPAP order to [facility]. Continue regular movement  and increase regular exercise-walking, biking, swimming, yoga, YouTube/Pinterest workout videos. I will discuss the alprazolam (Xanax) refill with Dr. Raliegh Scarlet and if she feels that a refill is appropriate we will call you pick up rx. Try guided imagery or breathing techniques when anxiety increases. Please schedule full physical this fall with Dr. Raliegh Scarlet.

## 2017-07-21 NOTE — Assessment & Plan Note (Signed)
New CPAP order provided. Continue to reduce-stop tobacco use.

## 2017-07-21 NOTE — Addendum Note (Signed)
Addended by: Lanier Prude D on: 07/21/2017 05:11 PM   Modules accepted: Orders

## 2017-07-22 ENCOUNTER — Telehealth: Payer: Self-pay

## 2017-07-22 ENCOUNTER — Telehealth: Payer: Self-pay | Admitting: Family Medicine

## 2017-07-22 ENCOUNTER — Other Ambulatory Visit: Payer: Self-pay | Admitting: Adult Health

## 2017-07-22 DIAGNOSIS — F411 Generalized anxiety disorder: Secondary | ICD-10-CM

## 2017-07-22 MED ORDER — ALPRAZOLAM 0.25 MG PO TABS: ORAL_TABLET | ORAL | 0 refills | Status: DC

## 2017-07-22 NOTE — Telephone Encounter (Signed)
Patient called asking about Xanax refill and needs a pulm referral

## 2017-07-22 NOTE — Addendum Note (Signed)
Addended by: Lanier Prude D on: 07/22/2017 10:39 AM   Modules accepted: Orders

## 2017-07-22 NOTE — Telephone Encounter (Signed)
Pt advised that alprazolam was refilled for #30 only and that any further refills would need to be approved by Dr. Raliegh Scarlet.  Pt expressed understanding and is agreeable.  Charyl Bigger, CMA

## 2017-07-22 NOTE — Progress Notes (Unsigned)
Discussed with Dr. Raliegh Scarlet and [place] Controlled Substance Database verified 30 count Alprazolam 0.25mg  rx provided. All RFs in future will need to come from Dr. Raliegh Scarlet.

## 2017-07-23 ENCOUNTER — Encounter: Payer: Self-pay | Admitting: Internal Medicine

## 2017-07-23 ENCOUNTER — Ambulatory Visit (INDEPENDENT_AMBULATORY_CARE_PROVIDER_SITE_OTHER): Payer: 59 | Admitting: Internal Medicine

## 2017-07-23 VITALS — BP 114/76 | HR 80 | Ht 72.0 in | Wt 232.0 lb

## 2017-07-23 DIAGNOSIS — Z9989 Dependence on other enabling machines and devices: Secondary | ICD-10-CM

## 2017-07-23 DIAGNOSIS — G4733 Obstructive sleep apnea (adult) (pediatric): Secondary | ICD-10-CM

## 2017-07-23 DIAGNOSIS — Z23 Encounter for immunization: Secondary | ICD-10-CM | POA: Diagnosis not present

## 2017-07-23 DIAGNOSIS — J449 Chronic obstructive pulmonary disease, unspecified: Secondary | ICD-10-CM

## 2017-07-23 DIAGNOSIS — F17200 Nicotine dependence, unspecified, uncomplicated: Secondary | ICD-10-CM

## 2017-07-23 MED ORDER — UMECLIDINIUM-VILANTEROL 62.5-25 MCG/INH IN AEPB: INHALATION_SPRAY | RESPIRATORY_TRACT | 12 refills | Status: DC

## 2017-07-23 NOTE — Patient Instructions (Signed)
Order- schedule unattended home sleep test     Dx OSA  Please call 2 weeks after your study for report and recommendations. I anticipate we will be ordering a new DME company ([facility] would be fine), and new CPAP machine with auto setting.  Order- office spirometry  Dx COPD mixed type  Flu vax  Sample and printed script to try Anoro Ellipta maintenance inhaler     Inhale 1 puff, once daily. Ok to continue to use your albuterol rescue inhaler if needed.  If you like the sample, ok to get the script filled.   Please call if we can help.
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
## 2017-07-24 ENCOUNTER — Encounter: Payer: Self-pay | Admitting: Internal Medicine

## 2017-07-24 NOTE — Assessment & Plan Note (Signed)
He has been compliant with and benefiting from CPAP using the same machine since original diagnosis over 12 years ago. We will need to update documentation with a new sleep study and get him a  new machine. He would like to change DME and suggests [facility]. Plan update sleep study didn't order new machine/new DME with change to auto Pap

## 2017-07-24 NOTE — Assessment & Plan Note (Addendum)
Exam and history suggest he probably has some degree of COPD mixed type or at least chronic bronchitis. Plan-emphasis on smoking cessation. Office spirometry. Sample Anoro for trial.

## 2017-07-24 NOTE — Assessment & Plan Note (Signed)
Tobacco cessation counseling done. Encouraged to look to his wife for support since she has recently quit.

## 2017-08-03 DIAGNOSIS — G4733 Obstructive sleep apnea (adult) (pediatric): Secondary | ICD-10-CM | POA: Diagnosis not present

## 2017-08-04 ENCOUNTER — Telehealth: Payer: Self-pay | Admitting: Internal Medicine

## 2017-08-04 DIAGNOSIS — G4733 Obstructive sleep apnea (adult) (pediatric): Secondary | ICD-10-CM

## 2017-08-04 NOTE — Telephone Encounter (Signed)
Spoke with pt, he states he is going out of town for a week in 9/19 for 10 days and would like Dr. Annamaria Boots to read his sleep results as soon as possible so he can take his machine with him. He states he knows he has sleep apnea and cannot sleep well without. He states last night was horrible for him. CY please advise. Can we look out for his results so I can order his new CPAP? Thanks.  [facility]  Current Outpatient Prescriptions on File Prior to Visit  Medication Sig Dispense Refill  . albuterol (VENTOLIN HFA) 108 (90 Base) MCG/ACT inhaler INHALE 2 PUFFS 2 TIMES A DAY AS NEEDED. 18 g 3  . ALPRAZolam (XANAX) 0.25 MG tablet TAKE 1 TABLET BY MOUTH ONLY AS NEEDED FOR PANIC ATTACKS 30 tablet 0  . tamsulosin (FLOMAX) 0.4 MG CAPS capsule Take 1 capsule (0.4 mg total) by mouth daily. 90 capsule 2  . umeclidinium-vilanterol (ANORO ELLIPTA) 62.5-25 MCG/INH AEPB Inhale 1 puff, once daily 60 each 12   No current facility-administered medications on file prior to visit.    Allergies  Allergen Reactions  . Hydrocodone Nausea Only

## 2017-08-04 NOTE — Telephone Encounter (Signed)
Called and spoke with pt and he is aware of CY recs.  New order has been placed to get a replacement cpap sent in to [facility].  Pt is aware that they should contact him to get him set up with this.

## 2017-08-04 NOTE — Telephone Encounter (Signed)
His sleep study confirms obstructive sleep apnea, averaging 12.2 apneas/ hour.  Order- new DME (he asks about [facility]), replacement for old CPAP machine, auto 5-20, mask of choice, humidifier, supplies, AirView    Dx OSA

## 2017-08-05 ENCOUNTER — Telehealth: Payer: Self-pay | Admitting: Family Medicine

## 2017-08-05 ENCOUNTER — Other Ambulatory Visit: Payer: Self-pay | Admitting: *Deleted

## 2017-08-05 DIAGNOSIS — G4733 Obstructive sleep apnea (adult) (pediatric): Secondary | ICD-10-CM

## 2017-08-05 NOTE — Telephone Encounter (Signed)
Patient cld states same time last year he had (thrush) & Dr. Raliegh Scarlet ordered him a Rx mouthwash that worked to clear it up-- Pt says has a reoccurrence of Thrush --Pt ask that either MA(Tonya) or provider cll him at [facility] mixed up the special mixture last year

## 2017-08-05 NOTE — Telephone Encounter (Signed)
LVM for pt advising that he will need OV for evaluation of problem.  Advised pt to call to schedule appt.  Charyl Bigger, CMA

## 2017-10-20 ENCOUNTER — Encounter: Payer: 59 | Admitting: Family Medicine

## 2017-10-21 ENCOUNTER — Ambulatory Visit: Payer: 59 | Admitting: Internal Medicine

## 2017-11-19 ENCOUNTER — Encounter: Payer: Self-pay | Admitting: Internal Medicine

## 2017-11-23 ENCOUNTER — Encounter: Payer: Self-pay | Admitting: Internal Medicine

## 2017-11-23 ENCOUNTER — Ambulatory Visit (INDEPENDENT_AMBULATORY_CARE_PROVIDER_SITE_OTHER): Payer: 59 | Admitting: Internal Medicine

## 2017-11-23 VITALS — BP 118/70 | HR 78 | Ht 72.0 in | Wt 235.6 lb

## 2017-11-23 DIAGNOSIS — Z720 Tobacco use: Secondary | ICD-10-CM | POA: Diagnosis not present

## 2017-11-23 DIAGNOSIS — J449 Chronic obstructive pulmonary disease, unspecified: Secondary | ICD-10-CM

## 2017-11-23 DIAGNOSIS — Z9989 Dependence on other enabling machines and devices: Secondary | ICD-10-CM | POA: Diagnosis not present

## 2017-11-23 DIAGNOSIS — Z0000 Encounter for general adult medical examination without abnormal findings: Secondary | ICD-10-CM

## 2017-11-23 DIAGNOSIS — G4733 Obstructive sleep apnea (adult) (pediatric): Secondary | ICD-10-CM

## 2017-11-23 MED ORDER — TAMSULOSIN HCL 0.4 MG PO CAPS: 0.4000 mg | ORAL_CAPSULE | Freq: Every day | ORAL | 2 refills | Status: DC

## 2017-11-23 MED ORDER — ALPRAZOLAM 0.25 MG PO TABS: ORAL_TABLET | ORAL | 0 refills | Status: DC

## 2017-11-23 MED ORDER — ALBUTEROL SULFATE HFA 108 (90 BASE) MCG/ACT IN AERS: INHALATION_SPRAY | RESPIRATORY_TRACT | 3 refills | Status: DC

## 2017-11-23 NOTE — Patient Instructions (Signed)
We can continue CPAP auto 5-20, mask of choice, humidifier, supplies, AirView  Refill scripts as requested  Order- referral to Primary Care to establish

## 2017-11-23 NOTE — Assessment & Plan Note (Addendum)
He has recognized real benefit with Anoro, taking care to rinse his mouth after use. Plan-smoking cessation,  continue Anoro

## 2017-11-23 NOTE — Assessment & Plan Note (Signed)
Good compliance and control.  He says he is sleeping very much better with this machine. Plan-continue CPAP auto 5-20
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
## 2017-11-23 NOTE — Assessment & Plan Note (Signed)
Smoking cessation emphasized with support available

## 2018-01-07 ENCOUNTER — Other Ambulatory Visit: Payer: Self-pay | Admitting: Internal Medicine

## 2018-01-07 NOTE — Telephone Encounter (Signed)
Rx has been called in per CY. 

## 2018-01-07 NOTE — Telephone Encounter (Signed)
Ok to refill total 6 months 

## 2018-01-07 NOTE — Telephone Encounter (Signed)
CY please advise on refill for Alprazolam 0.25mg .

## 2018-02-02 ENCOUNTER — Ambulatory Visit: Payer: 59 | Admitting: Nurse Practitioner

## 2018-03-25 ENCOUNTER — Ambulatory Visit: Payer: 59 | Admitting: Nurse Practitioner

## 2018-05-13 ENCOUNTER — Telehealth: Payer: Self-pay | Admitting: Internal Medicine

## 2018-05-13 NOTE — Telephone Encounter (Signed)
Spoke with Terri-wife to patient. Appointment was made for 08-26-18 with CY and that works for patient. Nothing more needed at this time.

## 2018-05-17 ENCOUNTER — Ambulatory Visit: Payer: 59 | Admitting: Internal Medicine

## 2018-07-07 ENCOUNTER — Other Ambulatory Visit: Payer: Self-pay | Admitting: Adult Health

## 2018-07-07 ENCOUNTER — Other Ambulatory Visit: Payer: Self-pay | Admitting: Internal Medicine

## 2018-07-07 NOTE — Telephone Encounter (Signed)
Ok to refill total 6 months 

## 2018-07-07 NOTE — Telephone Encounter (Signed)
Last filled by Dr Annamaria Boots. Please advise.

## 2018-07-07 NOTE — Telephone Encounter (Signed)
CY please advise on refill. Pt last seen 11-2017 has pending OV 08/2018 for OSA. Thanks.

## 2018-07-08 ENCOUNTER — Other Ambulatory Visit: Payer: Self-pay

## 2018-07-08 ENCOUNTER — Other Ambulatory Visit: Payer: Self-pay | Admitting: Internal Medicine

## 2018-07-08 NOTE — Telephone Encounter (Signed)
error 

## 2018-07-08 NOTE — Telephone Encounter (Signed)
Reviewed chart, medication was refill today by Dr. Janee Morn office. MPulliam, CMA/RT(R)

## 2018-07-08 NOTE — Telephone Encounter (Signed)
Melissa, Please forward to Dr. Jenetta Downer- this one is hers Thanks! Valetta Fuller

## 2018-08-26 ENCOUNTER — Ambulatory Visit (INDEPENDENT_AMBULATORY_CARE_PROVIDER_SITE_OTHER)
Admission: RE | Admit: 2018-08-26 | Discharge: 2018-08-26 | Disposition: A | Discharge status: Home / Self Care | Payer: 59 | Source: Ambulatory Visit | Attending: Internal Medicine | Admitting: Internal Medicine

## 2018-08-26 ENCOUNTER — Encounter: Payer: Self-pay | Admitting: Internal Medicine

## 2018-08-26 ENCOUNTER — Ambulatory Visit (INDEPENDENT_AMBULATORY_CARE_PROVIDER_SITE_OTHER): Payer: 59 | Admitting: Internal Medicine

## 2018-08-26 VITALS — BP 104/68 | HR 74 | Ht 72.0 in | Wt 237.4 lb

## 2018-08-26 DIAGNOSIS — Z23 Encounter for immunization: Secondary | ICD-10-CM

## 2018-08-26 DIAGNOSIS — G4733 Obstructive sleep apnea (adult) (pediatric): Secondary | ICD-10-CM | POA: Diagnosis not present

## 2018-08-26 DIAGNOSIS — J449 Chronic obstructive pulmonary disease, unspecified: Secondary | ICD-10-CM | POA: Diagnosis not present

## 2018-08-26 DIAGNOSIS — Z9989 Dependence on other enabling machines and devices: Secondary | ICD-10-CM

## 2018-08-26 MED ORDER — GLYCOPYRROLATE-FORMOTEROL 9-4.8 MCG/ACT IN AERO: 2.0000 | INHALATION_SPRAY | Freq: Two times a day (BID) | RESPIRATORY_TRACT | 12 refills | Status: DC

## 2018-08-26 MED ORDER — GLYCOPYRROLATE-FORMOTEROL 9-4.8 MCG/ACT IN AERO: 2.0000 | INHALATION_SPRAY | Freq: Every day | RESPIRATORY_TRACT | 0 refills | Status: DC

## 2018-08-26 NOTE — Progress Notes (Signed)
HPI male smoker followed for OSA, COPD, BPH, anxiety, GERD, obesity, Office Spirometry 07/23/17-moderate obstructive airways disease. FVC 3.42/63%, FEV1 2.71/64%, ratio 0.79, FEF 25-75% 2.62/71% HST 08/03/17-AHI 12.2/hour, desaturation to 86%, body weight 232 pounds -------------------------------------------------------------------------------  11/23/17-52 year old male smoker followed for OSA, COPD HST 08/03/17-AHI 12.2/hour, desaturation to 86%, body weight 232 pounds CPAP auto 5-20 [facility] ---6 month follow for OSA and wheezing. Was placed on Anoro. It has been working well for him.  Anoro, Ventolin HFA He is very pleased with his new CPAP machine and comfortable with the pressure.  Sleeping very well without snoring.  Download 100% compliance, AHI 1.2/hour He also says he is breathing much better with Anoro, although at first it seemed to irritate his mouth. He is working on cutting down smoking.  Rarely uses rescue inhaler. He is asking help to get established with a PCP downstairs in this building and asks bridge prescriptions for his meds until that appointment.  08/26/18-52 year old male smoker followed for OSA, COPD, BPH, anxiety, GERD, obesity, -----Follows for: OSA, doing well on CPAP, request, stopped Anoro due to making his mouth feel so dry, requests xray, still smoking, concerned  about family history CPAP auto 5-20/[facility] Download 100% compliance AHI 1.0/hour.  He sleeps well with CPAP. He tried an oral but says it burned his mouth so he could not use it, even though it did help his breathing.  ROS-see HPI   + = positive Constitutional:    weight loss, night sweats, fevers, chills, fatigue, lassitude. HEENT:    headaches, difficulty swallowing, tooth/dental problems, sore throat,       sneezing, itching, ear ache, nasal congestion, post nasal drip, snoring CV:    chest pain, orthopnea, PND, swelling in lower extremities, anasarca,                                   dizziness, palpitations Resp:   + shortness of breath with exertion or at rest.                productive cough,   non-productive cough, coughing up of blood.              change in color of mucus.  wheezing.   Skin:    rash or lesions. GI:  No-   heartburn, indigestion, abdominal pain, nausea, vomiting, diarrhea,                 change in bowel habits, loss of appetite GU: dysuria, change in color of urine, no urgency or frequency.   flank pain. MS:   joint pain, stiffness, decreased range of motion, back pain. Neuro-     nothing unusual Psych:  change in mood or affect.  depression or +anxiety.   memory loss.  OBJ- Physical Exam General- Alert, Oriented, Affect-appropriate, Distress- none acute Skin- rash-none, lesions- none, excoriation- none Lymphadenopathy- none Head- atraumatic            Eyes- Gross vision intact, PERRLA, conjunctivae and secretions clear            Ears- Hearing, canals-normal            Nose- Clear, no-Septal dev, mucus, polyps, erosion, perforation             Throat- Mallampati IV , mucosa clear , drainage- none, tonsils- atrophic, Neck- flexible , trachea midline, no stridor , thyroid nl, carotid no bruit Chest - symmetrical excursion , unlabored  Heart/CV- RRR , no murmur , no gallop  , no rub, nl s1 s2                           - JVD- none , edema- none, stasis changes- none, varices- none           Lung- , wheeze+/unlabored, cough- none , dullness-none, rub- none           Chest wall-  Abd-  Br/ Gen/ Rectal- Not done, not indicated Extrem- cyanosis- none, clubbing, none, atrophy- none, strength- nl Neuro- grossly intact to observation

## 2018-08-26 NOTE — Patient Instructions (Addendum)
Sample and print script for Bevespi inhaler    Inhale 2 puffs, twice daily       Try this instead of Anoro  Ok to continue albuterol rescue inhaler 2 puffs every 6 hours, as needed  Order- Flu vax standard  Order- CXR- dx COPD mixed type  Order Pneumovax-23 pneumonia vaccine  We can continue CPAP auto 5-20, mask of choice, humidifier, supplies, AirView  Please call if we can help

## 2018-08-29 NOTE — Assessment & Plan Note (Signed)
He is definitely benefiting from CPAP with improved sleep quality, confirmed by good download report. Plan-continue CPAP auto 5-20

## 2018-08-29 NOTE — Assessment & Plan Note (Signed)
Not sure why he could not tolerate Anoro, unless he had thrush at the time. Plan-flu vaccine, Pneumovax, replace Anoro with a sample of Bevespi

## 2018-08-31 ENCOUNTER — Telehealth: Payer: Self-pay | Admitting: Internal Medicine

## 2018-08-31 NOTE — Telephone Encounter (Signed)
Advised pt of results. Pt understood and nothing further is needed.    Notes recorded by Deneise Lever, MD on 08/26/2018 at 4:05 PM EDT CXR - No acute process. Lungs are over-inflated which can indicate COPD, but there is no mass or concern otherwise.

## 2018-09-02 ENCOUNTER — Other Ambulatory Visit: Payer: Self-pay | Admitting: Internal Medicine

## 2018-09-03 ENCOUNTER — Other Ambulatory Visit: Payer: Self-pay

## 2018-09-03 NOTE — Telephone Encounter (Signed)
We have not prescribed these medications for the patient previously.  Please review and refill if appropriate.  T. Denissa Cozart, CMA  

## 2018-09-06 ENCOUNTER — Other Ambulatory Visit: Payer: Self-pay | Admitting: Internal Medicine

## 2018-09-07 ENCOUNTER — Encounter: Payer: Self-pay | Admitting: Family Medicine

## 2018-09-07 ENCOUNTER — Ambulatory Visit (INDEPENDENT_AMBULATORY_CARE_PROVIDER_SITE_OTHER): Payer: 59 | Admitting: Family Medicine

## 2018-09-07 VITALS — BP 100/70 | HR 69 | Temp 98.0°F | Ht 72.0 in | Wt 238.4 lb

## 2018-09-07 DIAGNOSIS — Z0000 Encounter for general adult medical examination without abnormal findings: Secondary | ICD-10-CM | POA: Diagnosis not present

## 2018-09-07 DIAGNOSIS — J449 Chronic obstructive pulmonary disease, unspecified: Secondary | ICD-10-CM | POA: Diagnosis not present

## 2018-09-07 DIAGNOSIS — F411 Generalized anxiety disorder: Secondary | ICD-10-CM

## 2018-09-07 DIAGNOSIS — K219 Gastro-esophageal reflux disease without esophagitis: Secondary | ICD-10-CM

## 2018-09-07 DIAGNOSIS — N400 Enlarged prostate without lower urinary tract symptoms: Secondary | ICD-10-CM

## 2018-09-07 DIAGNOSIS — Z720 Tobacco use: Secondary | ICD-10-CM

## 2018-09-07 DIAGNOSIS — Z87442 Personal history of urinary calculi: Secondary | ICD-10-CM

## 2018-09-07 NOTE — Patient Instructions (Signed)
Colorectal Cancer Screening Colorectal cancer screening is a group of tests used to check for colorectal cancer. Colorectal refers to your colon and rectum. Your colon and rectum are located at the end of your large intestine and carry your bowel movements out of your body. Why is colorectal cancer screening done? It is common for abnormal growths (polyps) to form in the lining of your colon, especially as you get older. These polyps can be cancerous or become cancerous. If colorectal cancer is found at an early stage, it is treatable. Who should be screened for colorectal cancer? Screening is recommended for all adults at average risk starting at age 50. Tests may be recommended every 1 to 10 years. Your health care provider may recommend earlier or more frequent screening if you have:  A history of colorectal cancer or polyps.  A family member with a history of colorectal cancer or polyps.  Inflammatory bowel disease, such as ulcerative colitis or Crohn disease.  A type of hereditary colon cancer syndrome.  Colorectal cancer symptoms.  Types of screening tests There are several types of colorectal screening tests. They include:  Guaiac-based fecal occult blood testing.  Fecal immunochemical test (FIT).  Stool DNA test.  Barium enema.  Virtual colonoscopy.  Sigmoidoscopy. During this test, a sigmoidoscope is used to examine your rectum and lower colon. A sigmoidoscope is a flexible tube with a camera that is inserted through your anus into your rectum and lower colon.  Colonoscopy. During this test, a colonoscope is used to examine your entire colon. A colonoscope is a long, thin, flexible tube with a camera. This test examines your entire colon and rectum.  This information is not intended to replace advice given to you by your health care provider. Make sure you discuss any questions you have with your health care provider. Document Released: 04/23/2010 Document Revised:  06/12/2016 Document Reviewed: 02/09/2014 Elsevier Interactive Patient Education  2018 [employer] Inc[employer]  

## 2018-09-07 NOTE — Telephone Encounter (Signed)
Pt no longer a pt of Dr. Hershal Coria.  Charyl Bigger, CMA

## 2018-09-07 NOTE — Progress Notes (Signed)
Subjective:  Patient ID: Billy Thomas, male    DOB: 01/14/1966  Age: 52 y.o. MRN: [id number]  CC: Establish Care   HPI Billy Thomas presents for establishment of care and in discussion about his anxiety disorder.  He comes in today telling me that he has been taking Xanax 0.25 mg no more than twice a day for the last 7 or 8 years.  He has never required more than 30 pills in a given month and usually only takes no more than 15.  He tells me that he has been tried on what sounds like multiple SSRIs and these have not helped him.  Medical record shows that he has a past history of alcoholism.  When I asked about this he told me in no uncertain terms that he is not an alcoholic.  He quit drinking back in 2007 and has not had a drop since.  He has a history of early COPD and continues to smoke.  He is seeing a pulmonologist to also has asked him to quit smoking on multiple occasions.  History of BPH with lower urinary tract symptoms that have responded to tamsulosin.  History of multiple renal lithiasis.  He has had 2 shoulder surgeries and appendectomy.  He works for [employer] and travels from state to state.  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  He has been involved with rodeo and competitive horseback riding.  He has never had a colonoscopy.  Long-standing history of reflux dz. that he takes in over the counter proton pump inhibitor to treat.  He does move that he has not had a physical exam in some time now.  Outpatient Medications Prior to Visit  Medication Sig Dispense Refill  . albuterol (VENTOLIN HFA) 108 (90 Base) MCG/ACT inhaler INHALE 2 PUFFS 2 TIMES DAILY AS NEEDED 18 g 3  . ALPRAZolam (XANAX) 0.25 MG tablet TAKE 1 TABLET BY MOUTH DAILY AS NEEDED 30 tablet 5  . Glycopyrrolate-Formoterol (BEVESPI AEROSPHERE) 9-4.8 MCG/ACT AERO Inhale 2 puffs into the lungs 2 (two) times daily. 10.7 g 12  . tamsulosin (FLOMAX) 0.4 MG CAPS capsule Take 1 capsule (0.4 mg total) by mouth daily. 90  capsule 2  . Glycopyrrolate-Formoterol (BEVESPI AEROSPHERE) 9-4.8 MCG/ACT AERO Inhale 2 puffs into the lungs daily. 1 Inhaler 0   No facility-administered medications prior to visit.     ROS Review of Systems  Constitutional: Negative.   HENT: Negative.   Eyes: Negative for photophobia and visual disturbance.  Respiratory: Positive for cough. Negative for chest tightness.   Cardiovascular: Negative.   Endocrine: Negative for polyphagia and polyuria.  Genitourinary: Positive for difficulty urinating.  Skin: Negative for pallor and rash.  Allergic/Immunologic: Negative for immunocompromised state.  Neurological: Negative for numbness and headaches.  Hematological: Does not bruise/bleed easily.  Psychiatric/Behavioral: Negative for dysphoric mood. The patient is nervous/anxious.     Objective:  BP 100/70   Pulse 69   Temp 98 F (36.7 C) (Oral)   Ht 6' (1.829 m)   Wt 238 lb 6 oz (108.1 kg)   SpO2 95%   BMI 32.33 kg/m   BP Readings from Last 3 Encounters:  09/07/18 100/70  08/26/18 104/68  11/23/17 118/70    Wt Readings from Last 3 Encounters:  09/07/18 238 lb 6 oz (108.1 kg)  08/26/18 237 lb 6.4 oz (107.7 kg)  11/23/17 235 lb 9.6 oz (106.9 kg)    Physical Exam  Constitutional: He is oriented to person, place,  and time. He appears well-developed and well-nourished. No distress.  HENT:  Head: Normocephalic and atraumatic.  Right Ear: External ear normal.  Left Ear: External ear normal.  Eyes: Right eye exhibits no discharge. Left eye exhibits no discharge. No scleral icterus.  Neck: No JVD present. No tracheal deviation present.  Pulmonary/Chest: Effort normal.  Neurological: He is alert and oriented to person, place, and time.  Skin: Skin is warm and dry. He is not diaphoretic.  Psychiatric: He has a normal mood and affect. His behavior is normal.    Lab Results  Component Value Date   WBC 4.8 03/02/2017   HGB 13.0 03/02/2017   HCT 39.3 03/02/2017   PLT 222  03/02/2017   GLUCOSE 108 (H) 03/02/2017   CHOL 170 03/02/2017   TRIG 75 03/02/2017   HDL 47 03/02/2017   LDLCALC 108 (H) 03/02/2017   ALT 17 03/02/2017   AST 16 03/02/2017   NA 144 03/02/2017   K 4.9 03/02/2017   CL 104 03/02/2017   CREATININE 0.84 03/02/2017   BUN 17 03/02/2017   CO2 27 03/02/2017   TSH 1.080 03/02/2017   INR 1.0 04/11/2009   HGBA1C 6.0 (H) 03/02/2017    Dg Chest 2 View  Result Date: 08/26/2018 CLINICAL DATA:  History of COPD.  Smoker. EXAM: CHEST - 2 VIEW COMPARISON:  PA and lateral chest 04/10/2009. FINDINGS: The chest is hyperexpanded but the lungs are clear. Heart size is normal. No pneumothorax or pleural fluid. No acute or focal bony abnormality. IMPRESSION: Appearance of the chest compatible with COPD.  No acute disease. Electronically Signed   By: Inge Rise M.D.   On: 08/26/2018 15:16    Assessment & Plan:   Billy Thomas was seen today for establish care.  Diagnoses and all orders for this visit:  Gastroesophageal reflux disease without esophagitis -     Ambulatory referral to Gastroenterology  GAD (generalized anxiety disorder)  Healthcare maintenance -     Ambulatory referral to Gastroenterology  COPD mixed type (Billy Thomas)  Benign prostatic hyperplasia, unspecified whether lower urinary tract symptoms present  Tobacco abuse  History of kidney stones   I am having Billy Thomas maintain his tamsulosin, ALPRAZolam, albuterol, and Glycopyrrolate-Formoterol.  No orders of the defined types were placed in this encounter.  Will continue his low-dose PRN Xanax.  He knows that he will receive no more than 30 in a month's time.  Does discussed that he cannot drink alcohol with this medication.  He told me that was no problem.  Have gone ahead and referred to GI for an EGD and his first colonoscopy.  He will follow-up fasting for a physical exam.  We discussed using Chantix for smoking cessation.  Will consider Wellbutrin as well.  Follow-up:  Return return fasting for physical exam.  Billy Maw, MD

## 2018-09-08 ENCOUNTER — Other Ambulatory Visit: Payer: Self-pay

## 2018-09-08 MED ORDER — TAMSULOSIN HCL 0.4 MG PO CAPS: 0.4000 mg | ORAL_CAPSULE | Freq: Every day | ORAL | 1 refills | Status: DC

## 2018-09-21 ENCOUNTER — Ambulatory Visit (INDEPENDENT_AMBULATORY_CARE_PROVIDER_SITE_OTHER): Payer: 59 | Admitting: Family Medicine

## 2018-09-21 ENCOUNTER — Encounter: Payer: Self-pay | Admitting: Family Medicine

## 2018-09-21 VITALS — BP 100/70 | HR 64 | Temp 98.0°F | Ht 72.0 in | Wt 239.5 lb

## 2018-09-21 DIAGNOSIS — Z1211 Encounter for screening for malignant neoplasm of colon: Secondary | ICD-10-CM | POA: Diagnosis not present

## 2018-09-21 DIAGNOSIS — Z0000 Encounter for general adult medical examination without abnormal findings: Secondary | ICD-10-CM

## 2018-09-21 DIAGNOSIS — R7309 Other abnormal glucose: Secondary | ICD-10-CM | POA: Diagnosis not present

## 2018-09-21 DIAGNOSIS — Z125 Encounter for screening for malignant neoplasm of prostate: Secondary | ICD-10-CM | POA: Diagnosis not present

## 2018-09-21 LAB — URINALYSIS, ROUTINE W REFLEX MICROSCOPIC
Bilirubin Urine: NEGATIVE
Hgb urine dipstick: NEGATIVE
KETONES UR: NEGATIVE
LEUKOCYTES UA: NEGATIVE
Nitrite: NEGATIVE
PH: 7 (ref 5.0–8.0)
TOTAL PROTEIN, URINE-UPE24: NEGATIVE
Urine Glucose: NEGATIVE
Urobilinogen, UA: 0.2 (ref 0.0–1.0)

## 2018-09-21 LAB — COMPREHENSIVE METABOLIC PANEL
ALBUMIN: 4.5 g/dL (ref 3.5–5.2)
ALT: 19 U/L (ref 0–53)
AST: 15 U/L (ref 0–37)
Alkaline Phosphatase: 69 U/L (ref 39–117)
BUN: 19 mg/dL (ref 6–23)
CHLORIDE: 106 meq/L (ref 96–112)
CO2: 27 meq/L (ref 19–32)
Calcium: 9.3 mg/dL (ref 8.4–10.5)
Creatinine, Ser: 0.9 mg/dL (ref 0.40–1.50)
GFR: 94.52 mL/min (ref >60.00)
Glucose, Bld: 111 mg/dL — ABNORMAL HIGH (ref 70–99)
POTASSIUM: 4.3 meq/L (ref 3.5–5.1)
Sodium: 141 mEq/L (ref 135–145)
Total Bilirubin: 0.4 mg/dL (ref 0.2–1.2)
Total Protein: 6.7 g/dL (ref 6.0–8.3)

## 2018-09-21 LAB — LIPID PANEL
CHOLESTEROL: 184 mg/dL (ref 0–200)
HDL: 44.5 mg/dL (ref >39.00)
LDL Cholesterol: 118 mg/dL — ABNORMAL HIGH (ref 0–99)
NONHDL: 139.68
Total CHOL/HDL Ratio: 4
Triglycerides: 110 mg/dL (ref 0.0–149.0)
VLDL: 22 mg/dL (ref 0.0–40.0)

## 2018-09-21 LAB — CBC
HEMATOCRIT: 42 % (ref 39.0–52.0)
HEMOGLOBIN: 14.4 g/dL (ref 13.0–17.0)
MCHC: 34.3 g/dL (ref 30.0–36.0)
MCV: 95.9 fl (ref 78.0–100.0)
PLATELETS: 233 10*3/uL (ref 150.0–400.0)
RBC: 4.37 Mil/uL (ref 4.22–5.81)
RDW: 13.9 % (ref 11.5–15.5)
WBC: 5.1 10*3/uL (ref 4.0–10.5)

## 2018-09-21 LAB — PSA: PSA: 0.56 ng/mL (ref 0.10–4.00)

## 2018-09-21 LAB — HEMOGLOBIN A1C: HEMOGLOBIN A1C: 6.3 % (ref 4.6–6.5)

## 2018-09-21 NOTE — Patient Instructions (Addendum)
Health Maintenance, Male A healthy lifestyle and preventive care is important for your health and wellness. Ask your health care provider about what schedule of regular examinations is right for you. What should I know about weight and diet? Eat a Healthy Diet  Eat plenty of vegetables, fruits, whole grains, low-fat dairy products, and lean protein.  Do not eat a lot of foods high in solid fats, added sugars, or salt.  Maintain a Healthy Weight Regular exercise can help you achieve or maintain a healthy weight. You should:  Do at least 150 minutes of exercise each week. The exercise should increase your heart rate and make you sweat (moderate-intensity exercise).  Do strength-training exercises at least twice a week.  Watch Your Levels of Cholesterol and Blood Lipids  Have your blood tested for lipids and cholesterol every 5 years starting at 51 years of age. If you are at high risk for heart disease, you should start having your blood tested when you are 51 years old. You may need to have your cholesterol levels checked more often if: ? Your lipid or cholesterol levels are high. ? You are older than 50 years of age. ? You are at high risk for heart disease.  What should I know about cancer screening? Many types of cancers can be detected early and may often be prevented. Lung Cancer  You should be screened every year for lung cancer if: ? You are a current smoker who has smoked for at least 30 years. ? You are a former smoker who has quit within the past 15 years.  Talk to your health care provider about your screening options, when you should start screening, and how often you should be screened.  Colorectal Cancer  Routine colorectal cancer screening usually begins at 50 years of age and should be repeated every 5-10 years until you are 51 years old. You may need to be screened more often if early forms of precancerous polyps or small growths are found. Your health care provider  may recommend screening at an earlier age if you have risk factors for colon cancer.  Your health care provider may recommend using home test kits to check for hidden blood in the stool.  A small camera at the end of a tube can be used to examine your colon (sigmoidoscopy or colonoscopy). This checks for the earliest forms of colorectal cancer.  Prostate and Testicular Cancer  Depending on your age and overall health, your health care provider may do certain tests to screen for prostate and testicular cancer.  Talk to your health care provider about any symptoms or concerns you have about testicular or prostate cancer.  Skin Cancer  Check your skin from head to toe regularly.  Tell your health care provider about any new moles or changes in moles, especially if: ? There is a change in a mole's size, shape, or color. ? You have a mole that is larger than a pencil eraser.  Always use sunscreen. Apply sunscreen liberally and repeat throughout the day.  Protect yourself by wearing long sleeves, pants, a wide-brimmed hat, and sunglasses when outside.  What should I know about heart disease, diabetes, and high blood pressure?  If you are 18-39 years of age, have your blood pressure checked every 3-5 years. If you are 40 years of age or older, have your blood pressure checked every year. You should have your blood pressure measured twice-once when you are at a hospital or clinic, and once when   you are not at a hospital or clinic. Record the average of the two measurements. To check your blood pressure when you are not at a hospital or clinic, you can use: ? An automated blood pressure machine at a pharmacy. ? A home blood pressure monitor.  Talk to your health care provider about your target blood pressure.  If you are between 18-11 years old, ask your health care provider if you should take aspirin to prevent heart disease.  Have regular diabetes screenings by checking your fasting blood  sugar level. ? If you are at a normal weight and have a low risk for diabetes, have this test once every three years after the age of 70. ? If you are overweight and have a high risk for diabetes, consider being tested at a younger age or more often.  A one-time screening for abdominal aortic aneurysm (AAA) by ultrasound is recommended for men aged 65-75 years who are current or former smokers. What should I know about preventing infection? Hepatitis B If you have a higher risk for hepatitis B, you should be screened for this virus. Talk with your health care provider to find out if you are at risk for hepatitis B infection. Hepatitis C Blood testing is recommended for:  Everyone born from [birth year] through 1964.  Anyone with known risk factors for hepatitis C.  Sexually Transmitted Diseases (STDs)  You should be screened each year for STDs including gonorrhea and chlamydia if: ? You are sexually active and are younger than 51 years of age. ? You are older than 51 years of age and your health care provider tells you that you are at risk for this type of infection. ? Your sexual activity has changed since you were last screened and you are at an increased risk for chlamydia or gonorrhea. Ask your health care provider if you are at risk.  Talk with your health care provider about whether you are at high risk of being infected with HIV. Your health care provider may recommend a prescription medicine to help prevent HIV infection.  What else can I do?  Schedule regular health, dental, and eye exams.  Stay current with your vaccines (immunizations).  Do not use any tobacco products, such as cigarettes, chewing tobacco, and e-cigarettes. If you need help quitting, ask your health care provider.  Limit alcohol intake to no more than 2 drinks per day. One drink equals 12 ounces of beer, 5 ounces of wine, or 1 ounces of hard liquor.  Do not use street drugs.  Do not share needles.  Ask your  health care provider for help if you need support or information about quitting drugs.  Tell your health care provider if you often feel depressed.  Tell your health care provider if you have ever been abused or do not feel safe at home. This information is not intended to replace advice given to you by your health care provider. Make sure you discuss any questions you have with your health care provider. Document Released: 05/01/2008 Document Revised: 07/02/2016 Document Reviewed: 08/07/2015 Elsevier Interactive Patient Education  2018 [employer].  How to Increase Your Level of Physical Activity Getting regular physical activity is important for your overall health and well-being. Most people do not get enough exercise. There are easy ways to increase your level of physical activity, even if you have not been very active in the past or you are just starting out. Why is physical activity important? Physical activity has many  short-term and long-term health benefits. Regular exercise can:  Help you lose weight or maintain a healthy weight.  Strengthen your muscles and bones.  Boost your mood and improve self-esteem.  Reduce your risk of certain long-term (chronic) diseases, like heart disease, cancer, and diabetes.  Help you stay capable of walking and moving around (mobile) as you age.  Prevent accidents, such as falls, as you age.  Increase life expectancy.  What are the benefits of being physically active on a regular basis? In addition to improving your physical health, being physically active on most days of the week can help you in ways that you may not expect. Benefits of regular physical activity may include:  Feeling good about your body.  Being able to move around more easily and for longer periods of time without getting tired (increased stamina).  Finding new sources of fun and enjoyment.  Meeting new people who share a common interest.  Being able to fight off  illness better (enhanced immunity).  Being able to sleep better.  What can happen if I am not physically active on a regular basis? Not getting enough physical activity can lead to an unhealthy lifestyle and future health problems. This can increase your chances of:  Becoming overweight or obese.  Becoming sick.  Developing chronic illnesses, like heart disease or diabetes.  Having mental health problems, like depression or anxiety.  Having sleep problems.  Having trouble walking or getting yourself around (reduced mobility).  Injuring yourself in a fall as you get older.  What steps can I take to be more physically active?  Check with your health care provider about how to get started. Ask your health care provider what activities are safe for you.  Start out slowly. Walking or doing some simple chair exercises is a good place to start, especially if you have not been active before or for a long time.  Try to find activities that you enjoy. You are more likely to commit to an exercise routine if it does not feel like a chore.  If you have bone or joint problems, choose low-impact exercises, like walking or swimming.  Include physical activity in your everyday routine.  Invite friends or family members to exercise with you. This also will help you commit to your workout plan.  Set goals that you can work toward.  Aim for at least 150 minutes of moderate-intensity exercise each week. Examples of moderate-intensity exercise include walking or riding a bike. Where to find more information:  Centers for Disease Control and Prevention: [employer]  President's [employer] on [employer], Sports & Nutrition www.http://villegas.org/  ChooseMyPlate: [employer] Contact a health care provider if:  You have headaches, muscle aches, or joint pain.  You feel dizzy or light-headed while exercising.  You faint.  You have  chest pain while exercising. Summary  Exercise benefits your mind and body at any age, even if you are just starting out.  If you have a chronic illness or have not been active for a while, check with your health care provider before increasing your physical activity.  Choose activities that are safe and enjoyable for you.Ask your health care provider what activities are safe for you.  Start slowly. Tell your health care provider if you have problems as you start to increase your activity level. This information is not intended to replace advice given to you by your health care provider. Make sure you discuss any questions you have with your health care provider. Document Released:  10/23/2016 Document Revised: 10/23/2016 Document Reviewed: 10/23/2016 Elsevier Interactive Patient Education  2018 [place] with Quitting Smoking Quitting smoking is a physical and mental challenge. You will face cravings, withdrawal symptoms, and temptation. Before quitting, work with your health care provider to make a plan that can help you cope. Preparation can help you quit and keep you from giving in. How can I cope with cravings? Cravings usually last for 5-10 minutes. If you get through it, the craving will pass. Consider taking the following actions to help you cope with cravings:  Keep your mouth busy: ? Chew sugar-free gum. ? Suck on hard candies or a straw. ? Brush your teeth.  Keep your hands and body busy: ? Immediately change to a different activity when you feel a craving. ? Squeeze or play with a ball. ? Do an activity or a hobby, like making bead jewelry, practicing needlepoint, or working with wood. ? Mix up your normal routine. ? Take a short exercise break. Go for a quick walk or run up and down stairs. ? Spend time in public places where smoking is not allowed.  Focus on doing something kind or helpful for someone else.  Call a friend or family member to talk during a  craving.  Join a support group.  Call a quit line, such as 1-800-QUIT-NOW.  Talk with your health care provider about medicines that might help you cope with cravings and make quitting easier for you.  How can I deal with withdrawal symptoms? Your body may experience negative effects as it tries to get used to not having nicotine in the system. These effects are called withdrawal symptoms. They may include:  Feeling hungrier than normal.  Trouble concentrating.  Irritability.  Trouble sleeping.  Feeling depressed.  Restlessness and agitation.  Craving a cigarette.  To manage withdrawal symptoms:  Avoid places, people, and activities that trigger your cravings.  Remember why you want to quit.  Get plenty of sleep.  Avoid coffee and other caffeinated drinks. These may worsen some of your symptoms.  How can I handle social situations? Social situations can be difficult when you are quitting smoking, especially in the first few weeks. To manage this, you can:  Avoid parties, bars, and other social situations where people might be smoking.  Avoid alcohol.  Leave right away if you have the urge to smoke.  Explain to your family and friends that you are quitting smoking. Ask for understanding and support.  Plan activities with friends or family where smoking is not an option.  What are some ways I can cope with stress? Wanting to smoke may cause stress, and stress can make you want to smoke. Find ways to manage your stress. Relaxation techniques can help. For example:  Breathe slowly and deeply, in through your nose and out through your mouth.  Listen to soothing, relaxing music.  Talk with a family member or friend about your stress.  Light a candle.  Soak in a bath or take a shower.  Think about a peaceful place.  What are some ways I can prevent weight gain? Be aware that many people gain weight after they quit smoking. However, not everyone does. To keep  from gaining weight, have a plan in place before you quit and stick to the plan after you quit. Your plan should include:  Having healthy snacks. When you have a craving, it may help to: ? Eat plain popcorn, crunchy carrots, celery, or other cut vegetables. ?  Chew sugar-free gum.  Changing how you eat: ? Eat small portion sizes at meals. ? Eat 4-6 small meals throughout the day instead of 1-2 large meals a day. ? Be mindful when you eat. Do not watch television or do other things that might distract you as you eat.  Exercising regularly: ? Make time to exercise each day. If you do not have time for a long workout, do short bouts of exercise for 5-10 minutes several times a day. ? Do some form of strengthening exercise, like weight lifting, and some form of aerobic exercise, like running or swimming.  Drinking plenty of water or other low-calorie or no-calorie drinks. Drink 6-8 glasses of water daily, or as much as instructed by your health care provider.  Summary  Quitting smoking is a physical and mental challenge. You will face cravings, withdrawal symptoms, and temptation to smoke again. Preparation can help you as you go through these challenges.  You can cope with cravings by keeping your mouth busy (such as by chewing gum), keeping your body and hands busy, and making calls to family, friends, or a helpline for people who want to quit smoking.  You can cope with withdrawal symptoms by avoiding places where people smoke, avoiding drinks with caffeine, and getting plenty of rest.  Ask your health care provider about the different ways to prevent weight gain, avoid stress, and handle social situations. This information is not intended to replace advice given to you by your health care provider. Make sure you discuss any questions you have with your health care provider. Document Released: 10/31/2016 Document Revised: 10/31/2016 Document Reviewed: 10/31/2016 Elsevier Interactive Patient  Education  [employer].

## 2018-09-21 NOTE — Addendum Note (Signed)
Addended by: Kateri Mc E on: 09/21/2018 08:34 AM   Modules accepted: Orders

## 2018-09-21 NOTE — Progress Notes (Addendum)
Subjective:  Patient ID: Billy Thomas, male    DOB: November 30, 1965  Age: 52 y.o. MRN: [id number]  CC: Annual Exam   HPI Billy Thomas presents for complete physical exam and he is fasting today.  He has no complaints today.  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  He wonders if he needs to continue it.  His urine flow is good.  He has no pre-or "post void dribble he has no nocturia.  He has been experiencing some nocturia before he started CPAP.  He uses his CPAP nightly.  He is not interested in quitting smoking at this time.  He is scheduled for consultation for an EGD and colonoscopy.  Outpatient Medications Prior to Visit  Medication Sig Dispense Refill  . albuterol (VENTOLIN HFA) 108 (90 Base) MCG/ACT inhaler INHALE 2 PUFFS 2 TIMES DAILY AS NEEDED 18 g 3  . ALPRAZolam (XANAX) 0.25 MG tablet TAKE 1 TABLET BY MOUTH DAILY AS NEEDED 30 tablet 5  . Glycopyrrolate-Formoterol (BEVESPI AEROSPHERE) 9-4.8 MCG/ACT AERO Inhale 2 puffs into the lungs 2 (two) times daily. 10.7 g 12  . tamsulosin (FLOMAX) 0.4 MG CAPS capsule Take 1 capsule (0.4 mg total) by mouth daily. 90 capsule 1   No facility-administered medications prior to visit.     ROS Review of Systems  Constitutional: Negative.   HENT: Negative.   Eyes: Negative for photophobia and visual disturbance.  Respiratory: Negative.   Cardiovascular: Negative.   Endocrine: Negative for polyphagia and polyuria.  Genitourinary: Negative for difficulty urinating, hematuria and urgency.  Musculoskeletal: Negative for gait problem and joint swelling.  Skin: Negative for pallor and rash.  Allergic/Immunologic: Negative for immunocompromised state.  Neurological: Negative for seizures, speech difficulty and light-headedness.  Hematological: Does not bruise/bleed easily.  Psychiatric/Behavioral: Negative.     Objective:  BP 100/70 (BP Location: Left Arm, Patient Position: Sitting, Cuff Size: Large)    Pulse 64   Temp 98 F (36.7 C) (Oral)   Ht 6' (1.829 m)   Wt 239 lb 8 oz (108.6 kg)   SpO2 95%   BMI 32.48 kg/m   BP Readings from Last 3 Encounters:  09/21/18 100/70  09/07/18 100/70  08/26/18 104/68    Wt Readings from Last 3 Encounters:  09/21/18 239 lb 8 oz (108.6 kg)  09/07/18 238 lb 6 oz (108.1 kg)  08/26/18 237 lb 6.4 oz (107.7 kg)    Physical Exam  Constitutional: He is oriented to person, place, and time. He appears well-developed and well-nourished. No distress.  HENT:  Head: Normocephalic and atraumatic.  Right Ear: External ear normal.  Left Ear: External ear normal.  Mouth/Throat: Oropharynx is clear and moist. No oropharyngeal exudate.  Eyes: Pupils are equal, round, and reactive to light. Conjunctivae and EOM are normal. Right eye exhibits no discharge. Left eye exhibits no discharge. No scleral icterus.  Neck: Neck supple. No JVD present. No tracheal deviation present. No thyromegaly present.  Cardiovascular: Normal rate, regular rhythm and normal heart sounds.  Pulmonary/Chest: Effort normal. He has decreased breath sounds.  Abdominal: Soft. Bowel sounds are normal. He exhibits no distension and no mass. There is no tenderness. There is no rebound and no guarding. Hernia confirmed negative in the right inguinal area and confirmed negative in the left inguinal area.  Genitourinary: Testes normal and penis normal. Rectal exam shows no external hemorrhoid, no internal hemorrhoid, no fissure, no mass, no tenderness, anal tone normal and  guaiac negative stool. Prostate is not enlarged and not tender. Right testis shows no mass, no swelling and no tenderness. Right testis is descended. Left testis shows no mass, no swelling and no tenderness. Left testis is descended. Circumcised. No hypospadias, penile erythema or penile tenderness. No discharge found.  Musculoskeletal: He exhibits no edema.  Lymphadenopathy:    He has no cervical adenopathy. No inguinal adenopathy  noted on the right or left side.  Neurological: He is alert and oriented to person, place, and time.  Skin: Skin is warm and dry. No rash noted. He is not diaphoretic. No erythema.  Psychiatric: He has a normal mood and affect. His behavior is normal.    Lab Results  Component Value Date   WBC 5.1 09/21/2018   HGB 14.4 09/21/2018   HCT 42.0 09/21/2018   PLT 233.0 09/21/2018   GLUCOSE 111 (H) 09/21/2018   CHOL 184 09/21/2018   TRIG 110.0 09/21/2018   HDL 44.50 09/21/2018   LDLCALC 118 (H) 09/21/2018   ALT 19 09/21/2018   AST 15 09/21/2018   NA 141 09/21/2018   K 4.3 09/21/2018   CL 106 09/21/2018   CREATININE 0.90 09/21/2018   BUN 19 09/21/2018   CO2 27 09/21/2018   TSH 1.080 03/02/2017   PSA 0.56 09/21/2018   INR 1.0 04/11/2009   HGBA1C 6.3 09/21/2018    Dg Chest 2 View  Result Date: 08/26/2018 CLINICAL DATA:  History of COPD.  Smoker. EXAM: CHEST - 2 VIEW COMPARISON:  PA and lateral chest 04/10/2009. FINDINGS: The chest is hyperexpanded but the lungs are clear. Heart size is normal. No pneumothorax or pleural fluid. No acute or focal bony abnormality. IMPRESSION: Appearance of the chest compatible with COPD.  No acute disease. Electronically Signed   By: Inge Rise M.D.   On: 08/26/2018 15:16  The 10-year ASCVD risk score Mikey Bussing DC Jr., et al., 2013) is: 5.4%   Values used to calculate the score:     Age: 69 years     Sex: Male     Is Non-Hispanic African American: No     Diabetic: No     Tobacco smoker: Yes     Systolic Blood Pressure: 892 mmHg     Is BP treated: No     HDL Cholesterol: 44.5 mg/dL     Total Cholesterol: 184 mg/dL  Assessment & Plan:   [name] was seen today for annual exam.  Diagnoses and all orders for this visit:  Healthcare maintenance -     CBC -     Comprehensive metabolic panel -     Lipid panel -     PSA -     Urinalysis, Routine w reflex microscopic  Screening for colon cancer -     Ambulatory referral to  Gastroenterology  Elevated glucose -     Hemoglobin A1c   I am having [name] maintain his ALPRAZolam, albuterol, Glycopyrrolate-Formoterol, and tamsulosin.  No orders of the defined types were placed in this encounter.  Patient is not ready to quit smoking the day but would like to revisit that thought on another encounter.  Chart review did show a slightly elevated hemoglobin A1c last year we will recheck that today.  Discussed starting Glucophage as needed to his today's results of his hemoglobin A1c.  Once again I did mention that decreased air exchange in his lungs that I was actually hearing the cigarettes in his lungs.  Follow-up: Return in about 6 months (around  03/22/2019), or fu pends results lab results.Libby Maw, MD

## 2018-10-22 ENCOUNTER — Encounter: Payer: Self-pay | Admitting: Family Medicine

## 2018-12-27 ENCOUNTER — Ambulatory Visit (INDEPENDENT_AMBULATORY_CARE_PROVIDER_SITE_OTHER): Payer: 59 | Admitting: Internal Medicine

## 2018-12-27 ENCOUNTER — Encounter: Payer: Self-pay | Admitting: Internal Medicine

## 2018-12-27 VITALS — BP 130/70 | HR 71 | Ht 72.0 in | Wt 238.8 lb

## 2018-12-27 DIAGNOSIS — G4733 Obstructive sleep apnea (adult) (pediatric): Secondary | ICD-10-CM

## 2018-12-27 DIAGNOSIS — Z9989 Dependence on other enabling machines and devices: Secondary | ICD-10-CM | POA: Diagnosis not present

## 2018-12-27 DIAGNOSIS — J449 Chronic obstructive pulmonary disease, unspecified: Secondary | ICD-10-CM

## 2018-12-27 NOTE — Progress Notes (Signed)
HPI male smoker followed for OSA, COPD, BPH, anxiety, GERD, obesity, Office Spirometry 07/23/17-moderate obstructive airways disease. FVC 3.42/63%, FEV1 2.71/64%, ratio 0.79, FEF 25-75% 2.62/71% HST 08/03/17-AHI 12.2/hour, desaturation to 86%, body weight 232 pounds ------------------------------------------------------------------------------- 08/26/18-53 year old male smoker followed for OSA, COPD, BPH, anxiety, GERD, obesity, -----Follows for: OSA, doing well on CPAP, request, stopped Anoro due to making his mouth feel so dry, requests xray, still smoking, concerned  about family history CPAP auto 5-20/[facility] Download 100% compliance AHI 1.0/hour.  He sleeps well with CPAP. He tried Anoro but says it burned his mouth so he could not use it, even though it did help his breathing.  12/27/2018- 53 year old male smoker followed for OSA, COPD, BPH, anxiety, GERD, obesity, CPAP auto 5-20/[facility] -----CPAP machine and Bevespi working well for patient. Download 100% compliance AHI 1.2/hour Adderall, Bevespi He is very pleased with Bevespi and does not feel he has significant breathing concerns at this time.  Unfortunately he continues to smoke.  Feels well.  We discussed his CXR showing hyperinflation and he agrees to PFT. He is comfortable with CPAP with no complaints at all.  We reviewed download. CXR 08/26/2018- The chest is hyperexpanded but the lungs are clear. Heart size is normal. No pneumothorax or pleural fluid. No acute or focal bony abnormality. IMPRESSION: Appearance of the chest compatible with COPD.  No acute disease.  ROS-see HPI   + = positive Constitutional:    weight loss, night sweats, fevers, chills, fatigue, lassitude. HEENT:    headaches, difficulty swallowing, tooth/dental problems, sore throat,       sneezing, itching, ear ache, nasal congestion, post nasal drip, snoring CV:    chest pain, orthopnea, PND, swelling in lower extremities, anasarca,                                               dizziness, palpitations Resp:   + shortness of breath with exertion or at rest.                productive cough,   non-productive cough, coughing up of blood.              change in color of mucus.  wheezing.   Skin:    rash or lesions. GI:  No-   heartburn, indigestion, abdominal pain, nausea, vomiting, diarrhea,                 change in bowel habits, loss of appetite GU: dysuria, change in color of urine, no urgency or frequency.   flank pain. MS:   joint pain, stiffness, decreased range of motion, back pain. Neuro-     nothing unusual Psych:  change in mood or affect.  depression or +anxiety.   memory loss.  OBJ- Physical Exam General- Alert, Oriented, Affect-appropriate, Distress- none acute Skin- rash-none, lesions- none, excoriation- none Lymphadenopathy- none Head- atraumatic            Eyes- Gross vision intact, PERRLA, conjunctivae and secretions clear            Ears- Hearing, canals-normal            Nose- Clear, no-Septal dev, mucus, polyps, erosion, perforation             Throat- Mallampati IV , mucosa clear , drainage- none, tonsils- atrophic, Neck- flexible , trachea midline, no stridor , thyroid nl, carotid no  bruit Chest - symmetrical excursion , unlabored           Heart/CV- RRR , no murmur , no gallop  , no rub, nl s1 s2                           - JVD- none , edema- none, stasis changes- none, varices- none           Lung- , wheeze+/unlabored, cough- none , dullness-none, rub- none           Chest wall-  Abd-  Br/ Gen/ Rectal- Not done, not indicated Extrem- cyanosis- none, clubbing, none, atrophy- none, strength- nl Neuro- grossly intact to observation

## 2018-12-27 NOTE — Assessment & Plan Note (Signed)
He feels stable and comfortable with Bevespi.  Anoro caused mouth irritation. Plan-schedule full PFT.  Smoking cessation emphasized.

## 2018-12-27 NOTE — Assessment & Plan Note (Signed)
He continues to benefit from CPAP with improved sleep.  Download confirms excellent compliance and control.  He is very happy with this set up. Plan-continue CPAP auto 5-20

## 2018-12-27 NOTE — Patient Instructions (Signed)
We can continue CPAP auto 5-20, mask of choice, humidifier, supplies, Airview/ card  Order- please schedule PFT  Dx COPD mixed type  Please call if we can help

## 2019-01-18 ENCOUNTER — Other Ambulatory Visit: Payer: Self-pay

## 2019-01-18 MED ORDER — ALPRAZOLAM 0.25 MG PO TABS: ORAL_TABLET | ORAL | 0 refills | Status: DC

## 2019-01-18 NOTE — Telephone Encounter (Signed)
Refill request for Alprazolam 0.25 mg tablet, quantity of 30.  Last filled 12/09/2018. Last OV 09/21/2018  PMP checked.

## 2019-02-15 ENCOUNTER — Other Ambulatory Visit: Payer: Self-pay | Admitting: Family Medicine

## 2019-02-15 NOTE — Telephone Encounter (Signed)
Refill history suggests daily usage. This is not what he indicated to me. Needs WebEx.

## 2019-02-18 ENCOUNTER — Encounter: Payer: Self-pay | Admitting: Family Medicine

## 2019-02-18 NOTE — Telephone Encounter (Signed)
Message sent to patient. Awaiting response.

## 2019-03-17 ENCOUNTER — Other Ambulatory Visit: Payer: Self-pay | Admitting: Family Medicine

## 2019-04-27 ENCOUNTER — Ambulatory Visit: Payer: 59 | Admitting: Internal Medicine

## 2019-06-16 ENCOUNTER — Other Ambulatory Visit: Payer: Self-pay | Admitting: Family Medicine

## 2019-08-01 ENCOUNTER — Ambulatory Visit: Payer: 59 | Admitting: Internal Medicine

## 2019-08-15 ENCOUNTER — Other Ambulatory Visit: Payer: Self-pay | Admitting: Family Medicine

## 2019-08-15 ENCOUNTER — Other Ambulatory Visit: Payer: Self-pay | Admitting: Internal Medicine

## 2019-09-04 IMAGING — DX DG CHEST 2V
2 series · 2 of 2 positions shown · non-contrast
Comparison: PA and lateral chest 04/10/2009.

CLINICAL DATA: History of COPD.  Smoker.

EXAM:
CHEST - 2 VIEW

[chest pa]
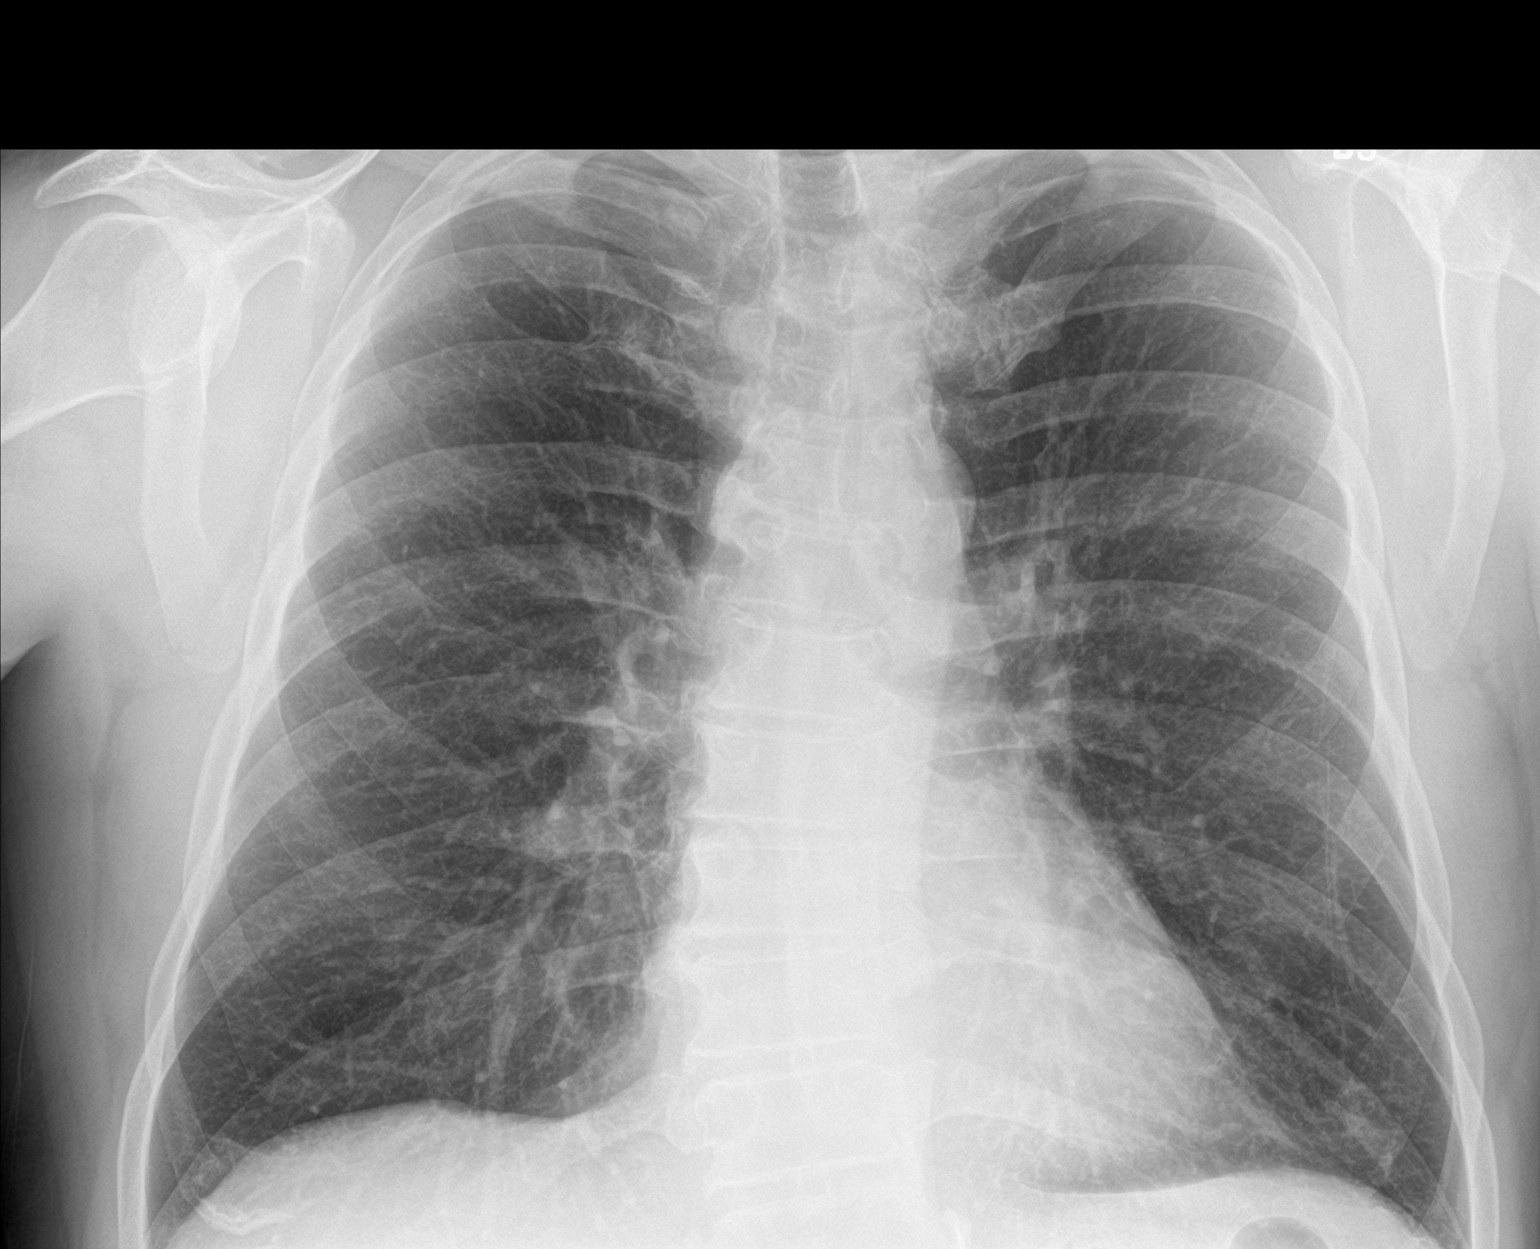

[chest lat]
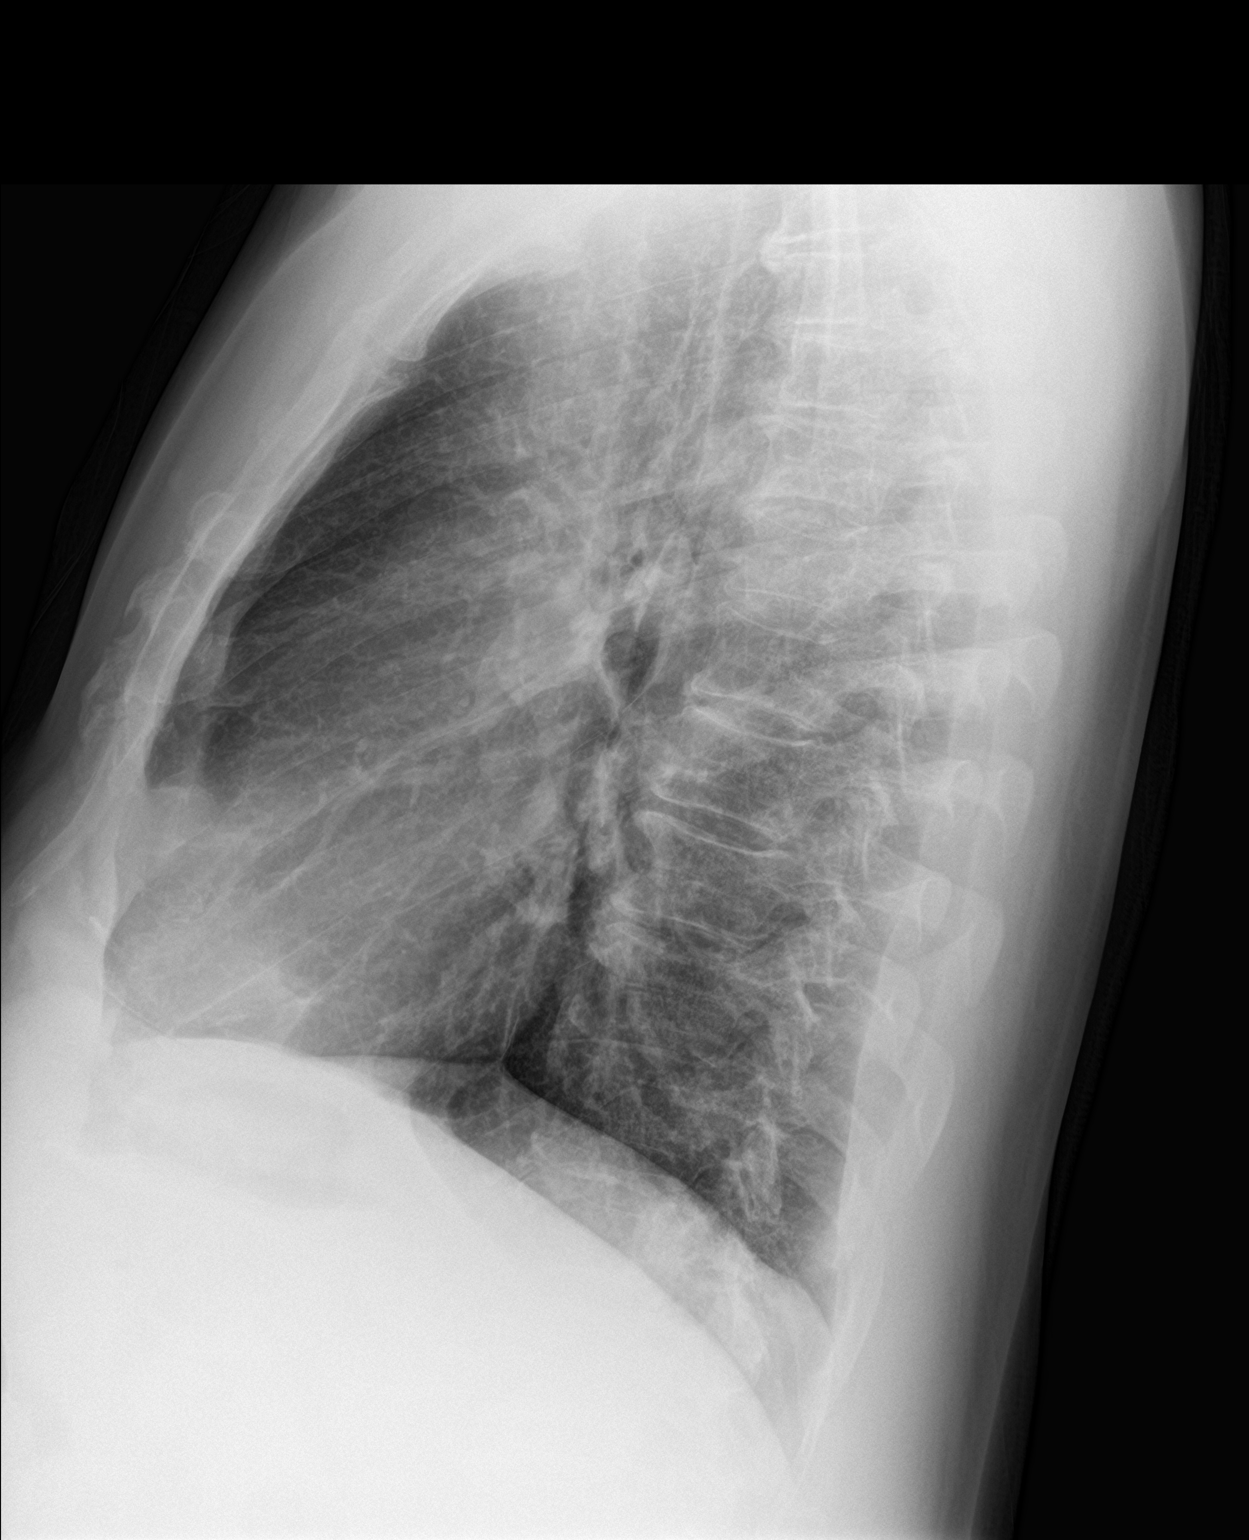

[2 of 2 positions shown; findings below may reference images not displayed]

FINDINGS: The chest is hyperexpanded but the lungs are clear. Heart size is
normal. No pneumothorax or pleural fluid. No acute or focal bony
abnormality.
IMPRESSION: Appearance of the chest compatible with COPD.  No acute disease.

## 2019-09-12 ENCOUNTER — Other Ambulatory Visit: Payer: Self-pay | Admitting: Family Medicine

## 2019-09-16 ENCOUNTER — Other Ambulatory Visit: Payer: Self-pay | Admitting: Internal Medicine

## 2019-10-10 ENCOUNTER — Other Ambulatory Visit: Payer: Self-pay | Admitting: Family Medicine

## 2019-12-14 ENCOUNTER — Other Ambulatory Visit: Payer: Self-pay | Admitting: Family Medicine

## 2019-12-14 NOTE — Telephone Encounter (Signed)
Called pt to schedule an appointment no answer, LMTCB

## 2019-12-15 NOTE — Telephone Encounter (Signed)
Spoke with patient, who verbally understood medications sent to pharmacy. I tried scheduling a OV appointment for pt to come in per pt he was not coming into the office due to covid. Virtual visit scheduled and patient aware that Dr. Ethelene Hal will not be able to refill him Xanax any longer due to no compliance with OV visits.

## 2020-01-18 ENCOUNTER — Encounter: Payer: Self-pay | Admitting: Family Medicine

## 2020-01-18 ENCOUNTER — Ambulatory Visit (INDEPENDENT_AMBULATORY_CARE_PROVIDER_SITE_OTHER): Payer: 59 | Admitting: Family Medicine

## 2020-01-18 VITALS — Ht 72.0 in | Wt 220.0 lb

## 2020-01-18 DIAGNOSIS — F411 Generalized anxiety disorder: Secondary | ICD-10-CM

## 2020-01-18 DIAGNOSIS — N400 Enlarged prostate without lower urinary tract symptoms: Secondary | ICD-10-CM | POA: Diagnosis not present

## 2020-01-18 DIAGNOSIS — Z0000 Encounter for general adult medical examination without abnormal findings: Secondary | ICD-10-CM | POA: Diagnosis not present

## 2020-01-18 NOTE — Progress Notes (Signed)
Established Patient Office Visit  Subjective:  Patient ID: Billy Thomas, male    DOB: October 13, 1966  Age: 54 y.o. MRN: [id number]  CC:  Chief Complaint  Patient presents with  . Follow-up    follow up on medications    HPI Billy Thomas presents for follow-up of his BPH symptoms and anxiety by Doxy today.  He has been doing well and has no complaints.  Rarely uses the alprazolam and actually has some left.  BP H symptoms are controlled with the tamsulosin.  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    Past Medical History:  Diagnosis Date  . Alcoholism ([place])   . Alcoholism ([place])    recovering  . Kidney stone 2010    Past Surgical History:  Procedure Laterality Date  . APPENDECTOMY    . CARDIAC CATHETERIZATION  2010  . SHOULDER SURGERY Bilateral 10 years ago    Family History  Problem Relation Age of Onset  . Cancer Father        lung  . Heart attack Paternal Grandfather   . Heart disease Paternal Grandfather     Social History   Socioeconomic History  . Marital status: Married    Spouse name: Not on file  . Number of children: Not on file  . Years of education: Not on file  . Highest education level: Not on file  Occupational History  . Not on file  Tobacco Use  . Smoking status: Current Every Day Smoker    Packs/day: 1.00    Years: 32.00    Pack years: 32.00  . Smokeless tobacco: Never Used  Substance and Sexual Activity  . Alcohol use: No  . Drug use: No  . Sexual activity: Yes    Birth control/protection: Post-menopausal  Other Topics Concern  . Not on file  Social History Narrative  . Not on file   Social Determinants of Health   Financial Resource Strain:   . Difficulty of Paying Living Expenses: Not on file  Food Insecurity:   . Worried About Charity fundraiser in the Last Year: Not on file  . Ran Out of Food in the Last Year: Not on file  Transportation Needs:   . Lack of Transportation  (Medical): Not on file  . Lack of Transportation (Non-Medical): Not on file  Physical Activity:   . Days of Exercise per Week: Not on file  . Minutes of Exercise per Session: Not on file  Stress:   . Feeling of Stress : Not on file  Social Connections:   . Frequency of Communication with Friends and Family: Not on file  . Frequency of Social Gatherings with Friends and Family: Not on file  . Attends Religious Services: Not on file  . Active Member of Clubs or Organizations: Not on file  . Attends Archivist Meetings: Not on file  . Marital Status: Not on file  Intimate Partner Violence:   . Fear of Current or Ex-Partner: Not on file  . Emotionally Abused: Not on file  . Physically Abused: Not on file  . Sexually Abused: Not on file    Outpatient Medications Prior to Visit  Medication Sig Dispense Refill  . ALPRAZolam (XANAX) 0.25 MG tablet TAKE 1 TABLET BY MOUTH DAILY AS NEEDED 30 tablet 0  . BEVESPI AEROSPHERE 9-4.8 MCG/ACT AERO INHALE 2 PUFFS INTO THE LUNGS 2 TIMES A DAY 10.7 g 12  .  tamsulosin (FLOMAX) 0.4 MG CAPS capsule TAKE 1 CAPSULE BY MOUTH DAILY. 30 capsule 1  . VENTOLIN HFA 108 (90 Base) MCG/ACT inhaler INHALE 2 PUFFS 2 TIMES DAILY AS NEEDED 18 g 3   No facility-administered medications prior to visit.    Allergies  Allergen Reactions  . Hydrocodone Nausea Only    ROS Review of Systems  Constitutional: Negative.   HENT: Negative.   Eyes: Negative for photophobia and visual disturbance.  Respiratory: Negative.   Cardiovascular: Negative.   Gastrointestinal: Negative.   Endocrine: Negative for polyphagia and polyuria.  Genitourinary: Negative.   Allergic/Immunologic: Negative for immunocompromised state.  Neurological: Negative.   Hematological: Does not bruise/bleed easily.  Psychiatric/Behavioral: Negative.       Objective:    Physical Exam  Constitutional: He is oriented to person, place, and time. He appears well-developed and  well-nourished. No distress.  HENT:  Head: Normocephalic and atraumatic.  Right Ear: External ear normal.  Left Ear: External ear normal.  Eyes: Conjunctivae are normal. Right eye exhibits no discharge. Left eye exhibits no discharge. No scleral icterus.  Neck: No JVD present. No tracheal deviation present.  Pulmonary/Chest: Effort normal. No stridor.  Neurological: He is alert and oriented to person, place, and time.  Skin: He is not diaphoretic.  Psychiatric: He has a normal mood and affect. His behavior is normal.    Ht 6' (1.829 m)   Wt 220 lb (99.8 kg)   BMI 29.84 kg/m  Wt Readings from Last 3 Encounters:  01/18/20 220 lb (99.8 kg)  12/27/18 238 lb 12.8 oz (108.3 kg)  09/21/18 239 lb 8 oz (108.6 kg)     Health Maintenance Due  Topic Date Due  . HIV Screening  08/20/1982  . COLONOSCOPY  08/20/2017  . INFLUENZA VACCINE  06/18/2019    There are no preventive care reminders to display for this patient.  Lab Results  Component Value Date   TSH 1.080 03/02/2017   Lab Results  Component Value Date   WBC 5.1 09/21/2018   HGB 14.4 09/21/2018   HCT 42.0 09/21/2018   MCV 95.9 09/21/2018   PLT 233.0 09/21/2018   Lab Results  Component Value Date   NA 141 09/21/2018   K 4.3 09/21/2018   CO2 27 09/21/2018   GLUCOSE 111 (H) 09/21/2018   BUN 19 09/21/2018   CREATININE 0.90 09/21/2018   BILITOT 0.4 09/21/2018   ALKPHOS 69 09/21/2018   AST 15 09/21/2018   ALT 19 09/21/2018   PROT 6.7 09/21/2018   ALBUMIN 4.5 09/21/2018   CALCIUM 9.3 09/21/2018   GFR 94.52 09/21/2018   Lab Results  Component Value Date   CHOL 184 09/21/2018   Lab Results  Component Value Date   HDL 44.50 09/21/2018   Lab Results  Component Value Date   LDLCALC 118 (H) 09/21/2018   Lab Results  Component Value Date   TRIG 110.0 09/21/2018   Lab Results  Component Value Date   CHOLHDL 4 09/21/2018   Lab Results  Component Value Date   HGBA1C 6.3 09/21/2018      Assessment &  Plan:   Problem List Items Addressed This Visit      Genitourinary   Benign prostatic hyperplasia   Relevant Orders   PSA     Other   GAD (generalized anxiety disorder) (Chronic)   Healthcare maintenance - Primary   Relevant Orders   CBC   Comprehensive metabolic panel   Lipid panel   PSA  Urinalysis, Routine w reflex microscopic   Hemoglobin A1c      No orders of the defined types were placed in this encounter.   Follow-up: Return in about 2 months (around 03/19/2020).   Will return for fasting blood work and in a few months for cpe.  Libby Maw, MD

## 2020-01-25 ENCOUNTER — Other Ambulatory Visit: Payer: Self-pay | Admitting: Internal Medicine

## 2020-03-02 ENCOUNTER — Other Ambulatory Visit: Payer: Self-pay | Admitting: Family Medicine

## 2020-03-02 ENCOUNTER — Other Ambulatory Visit: Payer: Self-pay | Admitting: Internal Medicine

## 2020-03-02 NOTE — Telephone Encounter (Signed)
Last OV 01/18/20 Last fill 12/14/19  #30/1

## 2020-05-07 ENCOUNTER — Other Ambulatory Visit: Payer: Self-pay | Admitting: Family Medicine

## 2020-10-04 ENCOUNTER — Other Ambulatory Visit: Payer: Self-pay | Admitting: Internal Medicine

## 2020-11-06 ENCOUNTER — Other Ambulatory Visit: Payer: Self-pay | Admitting: Family Medicine

## 2020-11-06 NOTE — Telephone Encounter (Signed)
Last OV 01/18/20 Last fill 05/08/20  #90/1

## 2020-12-06 ENCOUNTER — Other Ambulatory Visit: Payer: Self-pay | Admitting: Family Medicine

## 2020-12-11 ENCOUNTER — Encounter: Payer: Self-pay | Admitting: Family

## 2020-12-11 ENCOUNTER — Ambulatory Visit (INDEPENDENT_AMBULATORY_CARE_PROVIDER_SITE_OTHER): Payer: 59 | Admitting: Family

## 2020-12-11 ENCOUNTER — Other Ambulatory Visit: Payer: Self-pay

## 2020-12-11 VITALS — BP 104/62 | HR 78 | Temp 98.4°F | Ht 72.0 in | Wt 229.8 lb

## 2020-12-11 DIAGNOSIS — H02403 Unspecified ptosis of bilateral eyelids: Secondary | ICD-10-CM

## 2020-12-11 DIAGNOSIS — Z23 Encounter for immunization: Secondary | ICD-10-CM

## 2020-12-11 MED ORDER — ALPRAZOLAM 0.25 MG PO TABS: 0.2500 mg | ORAL_TABLET | Freq: Every day | ORAL | 3 refills | Status: DC | PRN

## 2020-12-11 MED ORDER — TAMSULOSIN HCL 0.4 MG PO CAPS: 0.4000 mg | ORAL_CAPSULE | Freq: Every day | ORAL | 3 refills | Status: DC

## 2020-12-11 NOTE — Patient Instructions (Signed)
Ptosis Repair, Care After This sheet gives you information about how to care for yourself after your procedure. Your health care provider may also give you more specific instructions. If you have problems or questions, contact your health care provider. What can I expect after the procedure? After the procedure, it is common to have:  Pain.  Swelling.  Bruising.  Trouble closing your eye, especially at night.  Dry eye. Follow these instructions at home: Incision care  Follow instructions from your health care provider about how to take care of the incision area.  You may have removable or absorbable stitches (sutures) in your eyelid incision. You may also have tiny adhesive strips placed over the incision. Leave the sutures or adhesive strips in place. They may need to stay in place for 2 weeks or longer. You may need to return to your surgeon to have these sutures and strips removed.  Check your incision area every day for signs of infection. Check for: ? More redness, swelling, or pain. ? Fluid or blood. ? Warmth. ? Pus or a bad smell.   Medicines  Take over-the-counter and prescription medicines only as told by your health care provider. This includes any moistening eye drops.  If you were prescribed antibiotic eye drops, use them as told by your health care provider. Do not stop using the antibiotic even if your condition improves. General instructions  If directed, put ice on the affected eye. ? Put ice in a plastic bag. ? Place a towel between your skin and the bag. ? Leave the ice on for 20 minutes, 2-3 times a day.  Keep your head raised (elevated) on a few pillows when resting and sleeping.  Avoid rubbing your eye.  Do not take baths, swim, or use a hot tub until your health care provider approves. Ask your health care provider if you may take showers. You may only be allowed to take sponge baths.  Do not wear contact lenses until your health care provider  approves.  Return to your normal activities as told by your health care provider. Ask your health care provider what activities are safe for you.  Keep all follow-up visits as told by your health care provider. This is important. Contact a health care provider if:  You have more redness, swelling, or pain in your eye.  You have a fever.  You have trouble closing your eye.  You develop new or worsening dry eye several weeks after surgery.  Your incision breaks open after being closed.  Your incision area feels warm to the touch.  You have a change in your vision. Get help right away if:  You have fluid, blood, or pus coming from your eyelids or eye.  You have severe loss of vision. Summary  Follow instructions from your health care provider about how to take care of the incision area.  Take over-the-counter and prescription medicines only as told by your health care provider. This includes any moistening eye drops.  Keep your head raised (elevated) on a few pillows when resting and sleeping.  Keep all follow-up visits as told by your health care provider. This information is not intended to replace advice given to you by your health care provider. Make sure you discuss any questions you have with your health care provider. Document Revised: 11/08/2018 Document Reviewed: 11/08/2018 Elsevier Patient Education  2021 [employer].

## 2020-12-13 NOTE — Progress Notes (Signed)
Acute Office Visit  Subjective:    Patient ID: Billy Thomas, male    DOB: 1966-10-31, 55 y.o.   MRN: [id number]  Chief Complaint  Patient presents with  . Follow-up    Refill/follow up on medication, concerns about extra skin on eye lids preventing vision.     HPI Patient is in today with c/o extra skin on his eye lids that obscures his vision especially at night. He is wanting to have a referral to have this repaired. He is concerns that when he drives long distances, his eyes sag more creating a safety issue. Also would like a refill on xanax that he uses periodically for anxiety  Past Medical History:  Diagnosis Date  . Alcoholism ([place])   . Alcoholism ([place])    recovering  . Kidney stone 2010    Past Surgical History:  Procedure Laterality Date  . APPENDECTOMY    . CARDIAC CATHETERIZATION  2010  . SHOULDER SURGERY Bilateral 10 years ago    Family History  Problem Relation Age of Onset  . Cancer Father        lung  . Heart attack Paternal Grandfather   . Heart disease Paternal Grandfather     Social History   Socioeconomic History  . Marital status: Married    Spouse name: Not on file  . Number of children: Not on file  . Years of education: Not on file  . Highest education level: Not on file  Occupational History  . Not on file  Tobacco Use  . Smoking status: Current Every Day Smoker    Packs/day: 1.00    Years: 32.00    Pack years: 32.00  . Smokeless tobacco: Never Used  Vaping Use  . Vaping Use: Never used  Substance and Sexual Activity  . Alcohol use: No  . Drug use: No  . Sexual activity: Yes    Birth control/protection: Post-menopausal  Other Topics Concern  . Not on file  Social History Narrative  . Not on file   Social Determinants of Health   Financial Resource Strain: Not on file  Food Insecurity: Not on file  Transportation Needs: Not on file  Physical Activity: Not on file  Stress: Not on file  Social Connections: Not on file   Intimate Partner Violence: Not on file    Outpatient Medications Prior to Visit  Medication Sig Dispense Refill  . BEVESPI AEROSPHERE 9-4.8 MCG/ACT AERO INHALE 2 PUFFS INTO THE LUNGS 2 TIMES A DAY 10.7 g 12  . VENTOLIN HFA 108 (90 Base) MCG/ACT inhaler INHALE 2 PUFFS 2 TIMES DAILY AS NEEDED 18 g 3  . ALPRAZolam (XANAX) 0.25 MG tablet TAKE 1 TABLET BY MOUTH DAILY AS NEEDED 30 tablet 0  . tamsulosin (FLOMAX) 0.4 MG CAPS capsule TAKE 1 CAPSULE BY MOUTH DAILY. 30 capsule 0   No facility-administered medications prior to visit.    Allergies  Allergen Reactions  . Hydrocodone Nausea Only    Review of Systems  Constitutional: Negative.   HENT: Negative.   Eyes: Positive for visual disturbance. Negative for pain.       Eye lids drooping  Respiratory: Negative.   Cardiovascular: Negative.   Endocrine: Negative.   Musculoskeletal: Negative.   Allergic/Immunologic: Negative.   Neurological: Negative.   Psychiatric/Behavioral: Negative.   All other systems reviewed and are negative.      Objective:    Physical Exam Constitutional:      Appearance: Normal appearance. He is obese.  HENT:  Right Ear: Tympanic membrane normal.     Left Ear: Tympanic membrane normal.  Eyes:     General:        Right eye: No foreign body or discharge.        Left eye: No foreign body.     Extraocular Movements: Extraocular movements intact.     Comments: Lids of both eye excess tissue noted  Cardiovascular:     Rate and Rhythm: Normal rate and regular rhythm.  Pulmonary:     Effort: Pulmonary effort is normal.     Breath sounds: Normal breath sounds.  Musculoskeletal:     Cervical back: Normal range of motion.  Neurological:     Mental Status: He is alert.     BP 104/62   Pulse 78   Temp 98.4 F (36.9 C) (Temporal)   Ht 6' (1.829 m)   Wt 229 lb 12.8 oz (104.2 kg)   SpO2 95%   BMI 31.17 kg/m  Wt Readings from Last 3 Encounters:  12/11/20 229 lb 12.8 oz (104.2 kg)  01/18/20 220  lb (99.8 kg)  12/27/18 238 lb 12.8 oz (108.3 kg)    Health Maintenance Due  Topic Date Due  . Hepatitis C Screening  Never done  . HIV Screening  Never done  . COLONOSCOPY (Pts 45-35yrs Insurance coverage will need to be confirmed)  Never done    There are no preventive care reminders to display for this patient.   Lab Results  Component Value Date   TSH 1.080 03/02/2017   Lab Results  Component Value Date   WBC 5.1 09/21/2018   HGB 14.4 09/21/2018   HCT 42.0 09/21/2018   MCV 95.9 09/21/2018   PLT 233.0 09/21/2018   Lab Results  Component Value Date   NA 141 09/21/2018   K 4.3 09/21/2018   CO2 27 09/21/2018   GLUCOSE 111 (H) 09/21/2018   BUN 19 09/21/2018   CREATININE 0.90 09/21/2018   BILITOT 0.4 09/21/2018   ALKPHOS 69 09/21/2018   AST 15 09/21/2018   ALT 19 09/21/2018   PROT 6.7 09/21/2018   ALBUMIN 4.5 09/21/2018   CALCIUM 9.3 09/21/2018   GFR 94.52 09/21/2018   Lab Results  Component Value Date   CHOL 184 09/21/2018   Lab Results  Component Value Date   HDL 44.50 09/21/2018   Lab Results  Component Value Date   LDLCALC 118 (H) 09/21/2018   Lab Results  Component Value Date   TRIG 110.0 09/21/2018   Lab Results  Component Value Date   CHOLHDL 4 09/21/2018   Lab Results  Component Value Date   HGBA1C 6.3 09/21/2018       Assessment & Plan:   Problem List Items Addressed This Visit   None   Visit Diagnoses    Need for influenza vaccination    -  Primary   Relevant Orders   Flu Vaccine QUAD 6+ mos PF IM (Fluarix Quad PF) (Completed)   Ptosis of both eyelids       Relevant Orders   Ambulatory referral to Plastic Surgery       Meds ordered this encounter  Medications  . ALPRAZolam (XANAX) 0.25 MG tablet    Sig: Take 1 tablet (0.25 mg total) by mouth daily as needed.    Dispense:  30 tablet    Refill:  3  . tamsulosin (FLOMAX) 0.4 MG CAPS capsule    Sig: Take 1 capsule (0.4 mg total) by mouth daily.  Dispense:  30 capsule     Refill:  3    Patient need a follow up visit before any other refills.   Call the office with any questions or concerns  Kennyth Arnold, FNP

## 2020-12-17 ENCOUNTER — Other Ambulatory Visit: Payer: Self-pay

## 2020-12-17 ENCOUNTER — Other Ambulatory Visit (INDEPENDENT_AMBULATORY_CARE_PROVIDER_SITE_OTHER): Payer: 59

## 2020-12-17 DIAGNOSIS — N400 Enlarged prostate without lower urinary tract symptoms: Secondary | ICD-10-CM | POA: Diagnosis not present

## 2020-12-17 DIAGNOSIS — Z0000 Encounter for general adult medical examination without abnormal findings: Secondary | ICD-10-CM

## 2020-12-17 LAB — URINALYSIS, ROUTINE W REFLEX MICROSCOPIC
Bilirubin Urine: NEGATIVE
Hgb urine dipstick: NEGATIVE
Ketones, ur: NEGATIVE
Leukocytes,Ua: NEGATIVE
Nitrite: NEGATIVE
RBC / HPF: NONE SEEN (ref 0–?)
Specific Gravity, Urine: 1.01 (ref 1.000–1.030)
Total Protein, Urine: NEGATIVE
Urine Glucose: NEGATIVE
Urobilinogen, UA: 0.2 (ref 0.0–1.0)
pH: 7 (ref 5.0–8.0)

## 2020-12-17 LAB — LIPID PANEL
Cholesterol: 182 mg/dL (ref 0–200)
HDL: 46.7 mg/dL (ref >39.00)
LDL Cholesterol: 112 mg/dL — ABNORMAL HIGH (ref 0–99)
NonHDL: 135.12
Total CHOL/HDL Ratio: 4
Triglycerides: 118 mg/dL (ref 0.0–149.0)
VLDL: 23.6 mg/dL (ref 0.0–40.0)

## 2020-12-17 LAB — CBC
HCT: 42.8 % (ref 39.0–52.0)
Hemoglobin: 14.7 g/dL (ref 13.0–17.0)
MCHC: 34.4 g/dL (ref 30.0–36.0)
MCV: 95.5 fl (ref 78.0–100.0)
Platelets: 228 10*3/uL (ref 150.0–400.0)
RBC: 4.48 Mil/uL (ref 4.22–5.81)
RDW: 13.6 % (ref 11.5–15.5)
WBC: 6.1 10*3/uL (ref 4.0–10.5)

## 2020-12-17 LAB — COMPREHENSIVE METABOLIC PANEL
ALT: 17 U/L (ref 0–53)
AST: 14 U/L (ref 0–37)
Albumin: 4.7 g/dL (ref 3.5–5.2)
Alkaline Phosphatase: 77 U/L (ref 39–117)
BUN: 18 mg/dL (ref 6–23)
CO2: 28 mEq/L (ref 19–32)
Calcium: 9.5 mg/dL (ref 8.4–10.5)
Chloride: 104 mEq/L (ref 96–112)
Creatinine, Ser: 0.88 mg/dL (ref 0.40–1.50)
GFR: 98.3 mL/min (ref >60.00)
Glucose, Bld: 122 mg/dL — ABNORMAL HIGH (ref 70–99)
Potassium: 4.4 mEq/L (ref 3.5–5.1)
Sodium: 139 mEq/L (ref 135–145)
Total Bilirubin: 0.4 mg/dL (ref 0.2–1.2)
Total Protein: 7.1 g/dL (ref 6.0–8.3)

## 2020-12-17 LAB — HEMOGLOBIN A1C: Hgb A1c MFr Bld: 6.2 % (ref 4.6–6.5)

## 2020-12-17 LAB — PSA: PSA: 0.64 ng/mL (ref 0.10–4.00)

## 2020-12-25 ENCOUNTER — Telehealth: Payer: Self-pay

## 2020-12-25 NOTE — Telephone Encounter (Signed)
Health screening form was faxed to [phone] and patient notified VIA phone. Dm/cma

## 2021-01-09 ENCOUNTER — Encounter: Payer: Self-pay | Admitting: Plastic Surgery

## 2021-01-09 ENCOUNTER — Other Ambulatory Visit: Payer: Self-pay

## 2021-01-09 ENCOUNTER — Ambulatory Visit (INDEPENDENT_AMBULATORY_CARE_PROVIDER_SITE_OTHER): Payer: 59 | Admitting: Plastic Surgery

## 2021-01-09 VITALS — BP 121/70 | HR 78 | Ht 72.0 in | Wt 232.2 lb

## 2021-01-09 DIAGNOSIS — H02835 Dermatochalasis of left lower eyelid: Secondary | ICD-10-CM | POA: Diagnosis not present

## 2021-01-09 DIAGNOSIS — H02832 Dermatochalasis of right lower eyelid: Secondary | ICD-10-CM

## 2021-01-09 DIAGNOSIS — H02834 Dermatochalasis of left upper eyelid: Secondary | ICD-10-CM | POA: Diagnosis not present

## 2021-01-09 DIAGNOSIS — H02831 Dermatochalasis of right upper eyelid: Secondary | ICD-10-CM | POA: Diagnosis not present

## 2021-01-09 NOTE — Progress Notes (Signed)
Referring Provider Libby Maw, MD [place],  [place] [id number]   CC:  Chief Complaint  Patient presents with  . Advice Only      Billy Thomas is an 55 y.o. male.  HPI: Patient presents to discuss his upper eyelids.  He feels like as time has gone by he has developed excess skin in his upper lids and particularly later in the day if causes a heaviness to his upper eyelids that limit his ability to see.  He describes at night sometimes needing to hold his eyelids off to drive safely.  He has not had any previous eye surgeries or procedures and is interested in having the excess skin removed.  He denies any dry eye symptoms.  Allergies  Allergen Reactions  . Hydrocodone Nausea Only    Outpatient Encounter Medications as of 01/09/2021  Medication Sig  . ALPRAZolam (XANAX) 0.25 MG tablet Take 1 tablet (0.25 mg total) by mouth daily as needed.  Marland Kitchen BEVESPI AEROSPHERE 9-4.8 MCG/ACT AERO INHALE 2 PUFFS INTO THE LUNGS 2 TIMES A DAY  . tamsulosin (FLOMAX) 0.4 MG CAPS capsule Take 1 capsule (0.4 mg total) by mouth daily.  . VENTOLIN HFA 108 (90 Base) MCG/ACT inhaler INHALE 2 PUFFS 2 TIMES DAILY AS NEEDED   No facility-administered encounter medications on file as of 01/09/2021.     Past Medical History:  Diagnosis Date  . Alcoholism ([place])   . Alcoholism ([place])    recovering  . Kidney stone 2010    Past Surgical History:  Procedure Laterality Date  . APPENDECTOMY    . CARDIAC CATHETERIZATION  2010  . SHOULDER SURGERY Bilateral 10 years ago    Family History  Problem Relation Age of Onset  . Cancer Father        lung  . Heart attack Paternal Grandfather   . Heart disease Paternal Grandfather     Social History   Social History Narrative  . Not on file     Review of Systems General: Denies fevers, chills, weight loss CV: Denies chest pain, shortness of breath, palpitations  Physical Exam Vitals with BMI 01/09/2021 12/11/2020 01/18/2020   Height 6\' 0"  6\' 0"  6\' 0"   Weight 232 lbs 3 oz 229 lbs 13 oz 220 lbs  BMI 31.49 90.24 09.73  Systolic 532 992 -  Diastolic 70 62 -  Pulse 78 78 -    General:  No acute distress,  Alert and oriented, Non-Toxic, Normal speech and affect Examination shows significant bilateral upper eyelid dermatochalasis.  His MRD 1 is about 3 mm.  His eyelid margins are symmetric and he seems to have good levator function.  He may have some very mild brow ptosis but it is not a significant concern for him.  His pupils are equal and round reactive to light.  Cranial nerves grossly intact.  Assessment/Plan Patient presents with symptomatic dermatochalasis.  I think is a good candidate for an upper lid blepharoplasty.  We discussed the details of the procedure along with the risks and benefits that include bleeding, infection, damage to surrounding structures and need for additional procedures.  We discussed the location and orientation of the scar and along with what to expect postoperatively.  I think he would benefit from a functional standpoint.  We will plan to send him for visual field study once that is complete move forward with insurance authorization.  All of his questions were answered.  Cindra Presume 01/09/2021, 11:13 AM

## 2021-01-10 ENCOUNTER — Encounter: Payer: Self-pay | Admitting: Family Medicine

## 2021-01-10 NOTE — Telephone Encounter (Signed)
Please advise message below, on my end it shows that the requested information be removed but the patient still see the same incorrect information.

## 2021-01-11 NOTE — Telephone Encounter (Signed)
Called pt, LMOR to return call.

## 2021-01-25 ENCOUNTER — Other Ambulatory Visit: Payer: Self-pay

## 2021-01-25 ENCOUNTER — Encounter (HOSPITAL_BASED_OUTPATIENT_CLINIC_OR_DEPARTMENT_OTHER): Payer: Self-pay | Admitting: Plastic Surgery

## 2021-01-28 ENCOUNTER — Encounter: Payer: Self-pay | Admitting: Surgical

## 2021-01-28 ENCOUNTER — Ambulatory Visit (INDEPENDENT_AMBULATORY_CARE_PROVIDER_SITE_OTHER): Payer: 59 | Admitting: Surgical

## 2021-01-28 ENCOUNTER — Other Ambulatory Visit: Payer: Self-pay

## 2021-01-28 VITALS — BP 96/59 | HR 66 | Ht 72.0 in | Wt 226.0 lb

## 2021-01-28 DIAGNOSIS — H02834 Dermatochalasis of left upper eyelid: Secondary | ICD-10-CM

## 2021-01-28 DIAGNOSIS — H02835 Dermatochalasis of left lower eyelid: Secondary | ICD-10-CM

## 2021-01-28 DIAGNOSIS — H02831 Dermatochalasis of right upper eyelid: Secondary | ICD-10-CM

## 2021-01-28 DIAGNOSIS — H02832 Dermatochalasis of right lower eyelid: Secondary | ICD-10-CM

## 2021-01-28 MED ORDER — ONDANSETRON HCL 4 MG PO TABS: 4.0000 mg | ORAL_TABLET | Freq: Three times a day (TID) | ORAL | 0 refills | Status: DC | PRN

## 2021-01-28 MED ORDER — HYDROCODONE-ACETAMINOPHEN 5-325 MG PO TABS: 1.0000 | ORAL_TABLET | Freq: Four times a day (QID) | ORAL | 0 refills | Status: AC | PRN

## 2021-01-28 NOTE — Progress Notes (Signed)
Patient ID: Billy Thomas, male    DOB: 15-Feb-1966, 55 y.o.   MRN: [id number]  Chief Complaint  Patient presents with  . Pre-op Exam      ICD-10-CM   1. 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  male  with a history of dermatochalasis.  He presents for preoperative evaluation for upcoming procedure, bilateral upper eyelid blepharoplasty, scheduled for 02/01/2021 with Dr. Claudia Desanctis.  The patient has not had problems with anesthesia. No history of DVT/PE.  No family history of DVT/PE.  No family or personal history of bleeding or clotting disorders.  Patient is not currently taking any blood thinners.  No history of CVA/MI.   Job: Armed forces operational officer  [place] Significant for: GERD, sleep apnea with use of CPAP, tobacco abuse, COPD He reports that he has not had any recent changes in his status.   Patient reports he smokes 1 to 1.5 packs of cigarettes per day.  He reports no fevers, chills, nausea, vomiting, chest pain.  Reports no shortness of breath at rest.  Past Medical History: Allergies: Allergies  Allergen Reactions  . Hydrocodone Nausea Only    Current Medications:  Current Outpatient Medications:  .  ALPRAZolam (XANAX) 0.25 MG tablet, Take 1 tablet (0.25 mg total) by mouth daily as needed., Disp: 30 tablet, Rfl: 3 .  BEVESPI AEROSPHERE 9-4.8 MCG/ACT AERO, INHALE 2 PUFFS INTO THE LUNGS 2 TIMES A DAY, Disp: 10.7 g, Rfl: 12 .  tamsulosin (FLOMAX) 0.4 MG CAPS capsule, Take 1 capsule (0.4 mg total) by mouth daily., Disp: 30 capsule, Rfl: 3 .  VENTOLIN HFA 108 (90 Base) MCG/ACT inhaler, INHALE 2 PUFFS 2 TIMES DAILY AS NEEDED, Disp: 18 g, Rfl: 3 .  omeprazole (PRILOSEC) 10 MG capsule, Take 10 mg by mouth daily., Disp: , Rfl:   Past Medical Problems: Past Medical History:  Diagnosis Date  . COPD (chronic obstructive pulmonary disease) ([place])    possible; pt states  unconfirmed  . GERD (gastroesophageal reflux disease)   . History of kidney stones   . Sleep apnea     Past Surgical History: Past Surgical History:  Procedure Laterality Date  . APPENDECTOMY    . CARDIAC CATHETERIZATION  2010  . RHINOPLASTY    . SHOULDER SURGERY Bilateral 10 years ago    Social History: Social History   Socioeconomic History  . Marital status: Married    Spouse name: Not on file  . Number of children: Not on file  . Years of education: Not on file  . Highest education level: Not on file  Occupational History  . Not on file  Tobacco Use  . Smoking status: Current Every Day Smoker    Packs/day: 1.00    Years: 32.00    Pack years: 32.00  . Smokeless tobacco: Never Used  Vaping Use  . Vaping Use: Never used  Substance and Sexual Activity  . Alcohol use: No  . Drug use: No  . Sexual activity: Yes    Birth control/protection: Post-menopausal  Other Topics Concern  . Not on file  Social History Narrative  . Not on file   Social Determinants of Health   Financial Resource Strain: Not on file  Food Insecurity: Not on file  Transportation Needs: Not on file  Physical Activity: Not on file  Stress: Not on file  Social Connections: Not on file  Intimate Partner Violence: Not on file    Family History: Family History  Problem Relation Age of Onset  . Cancer Father        lung  . Heart attack Paternal Grandfather   . Heart disease Paternal Grandfather     Review of Systems: Review of Systems  Constitutional: Negative.   Respiratory: Negative.   Cardiovascular: Negative.   Gastrointestinal: Negative.   Musculoskeletal: Negative.   Neurological: Negative.     Physical Exam: Vital Signs BP (!) 96/59 (BP Location: Left Arm, Patient Position: Sitting, Cuff Size: Large)   Pulse 66   Ht 6' (1.829 m)   Wt 226 lb (102.5 kg)   SpO2 97%   BMI 30.65 kg/m   Physical Exam  Constitutional:      General: Not in acute distress.    Appearance:  Normal appearance. Not ill-appearing.  HENT:     Head: Normocephalic and atraumatic.  Eyes:     Pupils: Pupils are equal, round Neck:     Musculoskeletal: Normal range of motion.  Cardiovascular:     Rate and Rhythm: Normal rate    Pulses: Normal pulses.  Pulmonary:     Effort: Pulmonary effort is normal. No respiratory distress.  Abdominal:     General: Abdomen is flat. There is no distension.     Palpations: Abdomen is soft.     Tenderness: There is no abdominal tenderness.  Musculoskeletal: Normal range of motion.  Skin:    General: Skin is warm and dry.     Findings: No erythema or rash.  Neurological:     General: No focal deficit present.     Mental Status: Alert and oriented to person, place, and time. Mental status is at baseline.     Motor: No weakness.  Psychiatric:        Mood and Affect: Mood normal.        Behavior: Behavior normal.    Assessment/Plan: The patient is scheduled for bilateral upper lid blepharoplasty with Dr. Claudia Desanctis.  Risks, benefits, and alternatives of procedure discussed, questions answered and consent obtained.    Smoking Status: 1 to 1.5 packs/day; Counseling Given?  Yes  Caprini Score: 6, high; Risk Factors include: Age, BMI greater than 25, questionable history of COPD, tobacco use/smoking and length of planned surgery. Recommendation for mechanical and pharmacological prophylaxis. Encourage early ambulation.  Given the procedure we will hold off on postoperative pharmacological prophylaxis.  Pictures obtained: 01/09/2021  Post-op Rx sent to pharmacy: Norco, Zofran  Patient was provided with the General Surgical Risk consent document and Pain Medication Agreement prior to their appointment.  They had adequate time to read through the risk consent documents and Pain Medication Agreement. We also discussed them in person together during this preop appointment. All of their questions were answered to their satisfaction.  Recommended calling if they  have any further questions.  Risk consent form and Pain Medication Agreement to be scanned into patient's chart.  The risks that can be encountered with and after a blepharoplasty were discussed and include the following but no limited to these:  Asymmetry, dry eyes, lid lag, sensitivity to sun or bright light, difficulty closing your eyes, outward rolling of the eyelid, change in vision, fluid accumulation, firmness of the area, fat necrosis with death of fat tissue, bleeding, infection, delayed healing, anesthesia risks, skin sensation changes, injury to structures including nerves, blood vessels, and muscles which may be  temporary or permanent, hair loss, allergies to tape, suture materials and glues, blood products, topical preparations or injected agents, skin and contour irregularities, skin discoloration and swelling, deep vein thrombosis, cardiac and pulmonary complications, pain, which may persist, persistent pain, recurrence, poor healing of the incision, possible need for revisional surgery or staged procedures. Thiere can also be persistent swelling, poor wound healing, rippling or loose skin, swelling. Any change in weight fluctuations can alter the outcome.  We discussed patient is at an increased risk of wound healing complications and slowed postoperative healing given his tobacco use.  Patient is understanding of this.  I did discuss with him that ideally I would recommend he quit smoking prior to surgery and remain abstinent from nicotine postoperatively to allow healing.  Patient is understanding of the risks.   Electronically signed by: Carola Rhine Scheeler, PA-C 01/28/2021 12:13 PM

## 2021-01-28 NOTE — H&P (View-Only) (Signed)
Patient ID: Billy Thomas, male    DOB: 1966/06/20, 55 y.o.   MRN: [id number]  Chief Complaint  Patient presents with   Pre-op Exam      ICD-10-CM   1. 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  male  with a history of dermatochalasis.  He presents for preoperative evaluation for upcoming procedure, bilateral upper eyelid blepharoplasty, scheduled for 02/01/2021 with Dr. Claudia Desanctis.  The patient has not had problems with anesthesia. No history of DVT/PE.  No family history of DVT/PE.  No family or personal history of bleeding or clotting disorders.  Patient is not currently taking any blood thinners.  No history of CVA/MI.   Job: Armed forces operational officer  [place] Significant for: GERD, sleep apnea with use of CPAP, tobacco abuse, COPD He reports that he has not had any recent changes in his status.   Patient reports he smokes 1 to 1.5 packs of cigarettes per day.  He reports no fevers, chills, nausea, vomiting, chest pain.  Reports no shortness of breath at rest.  Past Medical History: Allergies: Allergies  Allergen Reactions   Hydrocodone Nausea Only    Current Medications:  Current Outpatient Medications:    ALPRAZolam (XANAX) 0.25 MG tablet, Take 1 tablet (0.25 mg total) by mouth daily as needed., Disp: 30 tablet, Rfl: 3   BEVESPI AEROSPHERE 9-4.8 MCG/ACT AERO, INHALE 2 PUFFS INTO THE LUNGS 2 TIMES A DAY, Disp: 10.7 g, Rfl: 12   tamsulosin (FLOMAX) 0.4 MG CAPS capsule, Take 1 capsule (0.4 mg total) by mouth daily., Disp: 30 capsule, Rfl: 3   VENTOLIN HFA 108 (90 Base) MCG/ACT inhaler, INHALE 2 PUFFS 2 TIMES DAILY AS NEEDED, Disp: 18 g, Rfl: 3   omeprazole (PRILOSEC) 10 MG capsule, Take 10 mg by mouth daily., Disp: , Rfl:   Past Medical Problems: Past Medical History:  Diagnosis Date   COPD (chronic obstructive pulmonary disease) ([place])    possible; pt states  unconfirmed   GERD (gastroesophageal reflux disease)    History of kidney stones    Sleep apnea     Past Surgical History: Past Surgical History:  Procedure Laterality Date   APPENDECTOMY     CARDIAC CATHETERIZATION  2010   RHINOPLASTY     SHOULDER SURGERY Bilateral 10 years ago    Social History: Social History   Socioeconomic History   Marital status: Married    Spouse name: Not on file   Number of children: Not on file   Years of education: Not on file   Highest education level: Not on file  Occupational History   Not on file  Tobacco Use   Smoking status: Current Every Day Smoker    Packs/day: 1.00    Years: 32.00    Pack years: 32.00   Smokeless tobacco: Never Used  Scientific laboratory technician Use: Never used  Substance and Sexual Activity   Alcohol use: No   Drug use: No   Sexual activity: Yes    Birth control/protection: Post-menopausal  Other Topics Concern   Not on file  Social History Narrative   Not on file   Social Determinants of Health   Financial Resource Strain: Not on file  Food Insecurity: Not on file  Transportation Needs: Not on file  Physical Activity: Not on file  Stress: Not on file  Social Connections: Not on file  Intimate Partner Violence: Not on file    Family History: Family History  Problem Relation Age of Onset   Cancer Father        lung   Heart attack Paternal Grandfather    Heart disease Paternal Grandfather     Review of Systems: Review of Systems  Constitutional: Negative.   Respiratory: Negative.   Cardiovascular: Negative.   Gastrointestinal: Negative.   Musculoskeletal: Negative.   Neurological: Negative.     Physical Exam: Vital Signs BP (!) 96/59 (BP Location: Left Arm, Patient Position: Sitting, Cuff Size: Large)    Pulse 66    Ht 6' (1.829 m)    Wt 226 lb (102.5 kg)    SpO2 97%    BMI 30.65 kg/m   Physical Exam  Constitutional:      General: Not in acute distress.    Appearance:  Normal appearance. Not ill-appearing.  HENT:     Head: Normocephalic and atraumatic.  Eyes:     Pupils: Pupils are equal, round Neck:     Musculoskeletal: Normal range of motion.  Cardiovascular:     Rate and Rhythm: Normal rate    Pulses: Normal pulses.  Pulmonary:     Effort: Pulmonary effort is normal. No respiratory distress.  Abdominal:     General: Abdomen is flat. There is no distension.     Palpations: Abdomen is soft.     Tenderness: There is no abdominal tenderness.  Musculoskeletal: Normal range of motion.  Skin:    General: Skin is warm and dry.     Findings: No erythema or rash.  Neurological:     General: No focal deficit present.     Mental Status: Alert and oriented to person, place, and time. Mental status is at baseline.     Motor: No weakness.  Psychiatric:        Mood and Affect: Mood normal.        Behavior: Behavior normal.    Assessment/Plan: The patient is scheduled for bilateral upper lid blepharoplasty with Dr. Claudia Desanctis.  Risks, benefits, and alternatives of procedure discussed, questions answered and consent obtained.    Smoking Status: 1 to 1.5 packs/day; Counseling Given?  Yes  Caprini Score: 6, high; Risk Factors include: Age, BMI greater than 25, questionable history of COPD, tobacco use/smoking and length of planned surgery. Recommendation for mechanical and pharmacological prophylaxis. Encourage early ambulation.  Given the procedure we will hold off on postoperative pharmacological prophylaxis.  Pictures obtained: 01/09/2021  Post-op Rx sent to pharmacy: Norco, Zofran  Patient was provided with the General Surgical Risk consent document and Pain Medication Agreement prior to their appointment.  They had adequate time to read through the risk consent documents and Pain Medication Agreement. We also discussed them in person together during this preop appointment. All of their questions were answered to their satisfaction.  Recommended calling if they  have any further questions.  Risk consent form and Pain Medication Agreement to be scanned into patient's chart.  The risks that can be encountered with and after a blepharoplasty were discussed and include the following but no limited to these:  Asymmetry, dry eyes, lid lag, sensitivity to sun or bright light, difficulty closing your eyes, outward rolling of the eyelid, change in vision, fluid accumulation, firmness of the area, fat necrosis with death of fat tissue, bleeding, infection, delayed healing, anesthesia risks, skin sensation changes, injury to structures including nerves, blood vessels,  and muscles which may be temporary or permanent, hair loss, allergies to tape, suture materials and glues, blood products, topical preparations or injected agents, skin and contour irregularities, skin discoloration and swelling, deep vein thrombosis, cardiac and pulmonary complications, pain, which may persist, persistent pain, recurrence, poor healing of the incision, possible need for revisional surgery or staged procedures. Thiere can also be persistent swelling, poor wound healing, rippling or loose skin, swelling. Any change in weight fluctuations can alter the outcome.  We discussed patient is at an increased risk of wound healing complications and slowed postoperative healing given his tobacco use.  Patient is understanding of this.  I did discuss with him that ideally I would recommend he quit smoking prior to surgery and remain abstinent from nicotine postoperatively to allow healing.  Patient is understanding of the risks.   Electronically signed by: Carola Rhine Lyndel Dancel, PA-C 01/28/2021 12:13 PM

## 2021-01-29 ENCOUNTER — Encounter (HOSPITAL_COMMUNITY): Payer: Self-pay | Admitting: Emergency Medicine

## 2021-01-29 ENCOUNTER — Emergency Department (HOSPITAL_COMMUNITY): Payer: 59

## 2021-01-29 ENCOUNTER — Emergency Department (HOSPITAL_COMMUNITY)
Admission: EM | Admit: 2021-01-29 | Discharge: 2021-01-29 | Disposition: A | Discharge status: Home / Self Care | Payer: 59 | Attending: Emergency Medicine | Admitting: Emergency Medicine

## 2021-01-29 ENCOUNTER — Ambulatory Visit (HOSPITAL_COMMUNITY)
Admission: EM | Admit: 2021-01-29 | Discharge: 2021-01-29 | Disposition: A | Discharge status: Home / Self Care | Payer: 59 | Attending: Urgent Care | Admitting: Urgent Care

## 2021-01-29 ENCOUNTER — Other Ambulatory Visit: Payer: Self-pay

## 2021-01-29 DIAGNOSIS — Z7951 Long term (current) use of inhaled steroids: Secondary | ICD-10-CM | POA: Insufficient documentation

## 2021-01-29 DIAGNOSIS — R202 Paresthesia of skin: Secondary | ICD-10-CM | POA: Insufficient documentation

## 2021-01-29 DIAGNOSIS — R0789 Other chest pain: Secondary | ICD-10-CM | POA: Insufficient documentation

## 2021-01-29 DIAGNOSIS — J449 Chronic obstructive pulmonary disease, unspecified: Secondary | ICD-10-CM | POA: Diagnosis not present

## 2021-01-29 DIAGNOSIS — F17200 Nicotine dependence, unspecified, uncomplicated: Secondary | ICD-10-CM | POA: Diagnosis not present

## 2021-01-29 DIAGNOSIS — M25512 Pain in left shoulder: Secondary | ICD-10-CM | POA: Diagnosis not present

## 2021-01-29 LAB — CBC
HCT: 42.7 % (ref 39.0–52.0)
Hemoglobin: 14.1 g/dL (ref 13.0–17.0)
MCH: 32.3 pg (ref 26.0–34.0)
MCHC: 33 g/dL (ref 30.0–36.0)
MCV: 97.7 fL (ref 80.0–100.0)
Platelets: 216 10*3/uL (ref 150–400)
RBC: 4.37 MIL/uL (ref 4.22–5.81)
RDW: 13.2 % (ref 11.5–15.5)
WBC: 6.7 10*3/uL (ref 4.0–10.5)
nRBC: 0 % (ref 0.0–0.2)

## 2021-01-29 LAB — BASIC METABOLIC PANEL
Anion gap: 8 (ref 5–15)
BUN: 15 mg/dL (ref 6–20)
CO2: 26 mmol/L (ref 22–32)
Calcium: 9 mg/dL (ref 8.9–10.3)
Chloride: 105 mmol/L (ref 98–111)
Creatinine, Ser: 0.97 mg/dL (ref 0.61–1.24)
GFR, Estimated: >60 mL/min (ref >60)
Glucose, Bld: 152 mg/dL — ABNORMAL HIGH (ref 70–99)
Potassium: 4.1 mmol/L (ref 3.5–5.1)
Sodium: 139 mmol/L (ref 135–145)

## 2021-01-29 LAB — TROPONIN I (HIGH SENSITIVITY)
Troponin I (High Sensitivity): 2 ng/L (ref <18)
Troponin I (High Sensitivity): 3 ng/L (ref <18)

## 2021-01-29 MED ORDER — CYCLOBENZAPRINE HCL 10 MG PO TABS: 10.0000 mg | ORAL_TABLET | Freq: Two times a day (BID) | ORAL | 0 refills | Status: DC | PRN

## 2021-01-29 NOTE — Discharge Instructions (Addendum)
You have been seen here for left chest and shoulder pain.  I have given you a prescription for a muscle relaxer please take as prescribed.  Please beware this medication can  make you drowsy do not consume alcohol or operate heavy machinery or take this medication.  I recommend taking over-the-counter pain medications like ibuprofen and/or Tylenol every 6 as needed.  Please follow dosage and on the back of bottle.  I also recommend applying heat to the area and stretching out the muscles as this will help decrease stiffness and pain.  I have given you information on exercises please follow.  Please follow-up with your PCP for further evaluation.  Come back to the emergency department if you develop chest pain, shortness of breath, severe abdominal pain, uncontrolled nausea, vomiting, diarrhea.

## 2021-01-29 NOTE — ED Notes (Signed)
Pt reports Sunday he had a knot in his shoulder blade. He reports Monday he had numbness from mid upper arm to underarm that is still numb and reports that the pain moved L lateral chest. Pt reports he went to bed and hought he was ok this morning and reports a continuous burning pain L sided that moved further towards midline w/ numbness and pain in L shoulder. Pt denies shob. Pt reports he was driving today and pulled over on the side of the road and felt lightheaded and reported he had L leg numbness and foot numbness. Pt reports he "flagged down the police" and asked them to follow him to the ED. Pt reports the pain is more of a continuous burning/tearing feeling.

## 2021-01-29 NOTE — ED Triage Notes (Addendum)
Sunday noticed pain in left shoulder blade, no altered by movement and no known injury.  Numbness in left axilla to mid left upper arm.  Burning in left side of torso radiating around left arm.  No visible rash on left side  Prior to coming here, noticed tingling in left leg, both feet, lightheaded. Patient is very anxious  Denies sob.  But left side of torso feels" funny on the outside"

## 2021-01-29 NOTE — ED Triage Notes (Signed)
Reports "knot" on L shoulder blade on Sunday.  Monday he started having numbness from L elbow to L axilla.  Reports intermittent L sided chest pain that started today.  Also reports L leg tinging and L eye "not normal" today.  No arm drift.  Speech clear.  Face symmetrical.

## 2021-01-29 NOTE — ED Notes (Signed)
Patient is being discharged from the Urgent Care and sent to the Emergency Department via POV . Per [place], [place], patient is in need of higher level of care due to chest complaint and risk factors. Patient is aware and verbalizes understanding of plan of care.  Vitals:   01/29/21 1031  BP: 107/64  Pulse: 65  Resp: 20  Temp: 97.6 F (36.4 C)  SpO2: 100%

## 2021-01-29 NOTE — ED Provider Notes (Signed)
Billy Thomas   MRN: [id number] DOB: Jan 30, 1966  Subjective:   Billy Thomas is a 55 y.o. male with pmh COPD, tobacco abuse, obesity, alcoholism presenting for 3-day history of acute onset pain over the top left side of his chest that has progressed to the left side of his arm into the lower side of his left chest.  Has also had some numbness and tingling along the lateral chest wall on the left side extending into the left arm.  Currently patient's discomfort is rated a 6 out of 10.  Patient has previously had a cardiac catheterization in 2010 and reports that it was completely normal.  He also has a history of anxiety and uses Xanax for this.  No current facility-administered medications for this encounter.  Current Outpatient Medications:  .  ALPRAZolam (XANAX) 0.25 MG tablet, Take 1 tablet (0.25 mg total) by mouth daily as needed., Disp: 30 tablet, Rfl: 3 .  omeprazole (PRILOSEC) 10 MG capsule, Take 10 mg by mouth daily., Disp: , Rfl:  .  BEVESPI AEROSPHERE 9-4.8 MCG/ACT AERO, INHALE 2 PUFFS INTO THE LUNGS 2 TIMES A DAY, Disp: 10.7 g, Rfl: 12 .  HYDROcodone-acetaminophen (NORCO) 5-325 MG tablet, Take 1 tablet by mouth every 6 (six) hours as needed for up to 5 days for severe pain., Disp: 20 tablet, Rfl: 0 .  ondansetron (ZOFRAN) 4 MG tablet, Take 1 tablet (4 mg total) by mouth every 8 (eight) hours as needed for nausea or vomiting., Disp: 20 tablet, Rfl: 0 .  tamsulosin (FLOMAX) 0.4 MG CAPS capsule, Take 1 capsule (0.4 mg total) by mouth daily., Disp: 30 capsule, Rfl: 3 .  VENTOLIN HFA 108 (90 Base) MCG/ACT inhaler, INHALE 2 PUFFS 2 TIMES DAILY AS NEEDED, Disp: 18 g, Rfl: 3   Allergies  Allergen Reactions  . Hydrocodone Nausea Only    Past Medical History:  Diagnosis Date  . COPD (chronic obstructive pulmonary disease) ([place])    possible; pt states unconfirmed  . GERD (gastroesophageal reflux disease)   . History of kidney stones   . Sleep apnea      Past  Surgical History:  Procedure Laterality Date  . APPENDECTOMY    . CARDIAC CATHETERIZATION  2010  . RHINOPLASTY    . SHOULDER SURGERY Bilateral 10 years ago    Family History  Problem Relation Age of Onset  . Cancer Father        lung  . Heart attack Paternal Grandfather   . Heart disease Paternal Grandfather     Social History   Tobacco Use  . Smoking status: Current Every Day Smoker    Packs/day: 1.00    Years: 32.00    Pack years: 32.00  . Smokeless tobacco: Never Used  Vaping Use  . Vaping Use: Never used  Substance Use Topics  . Alcohol use: No  . Drug use: No    ROS   Objective:   Vitals: BP 107/64 (BP Location: Right Arm) Comment (BP Location): larg cuff  Pulse 65   Temp 97.6 F (36.4 C) (Oral)   Resp 20   SpO2 100%   Physical Exam Constitutional:      General: He is not in acute distress.    Appearance: Normal appearance. He is well-developed. He is not ill-appearing, toxic-appearing or diaphoretic.  HENT:     Head: Normocephalic and atraumatic.     Right Ear: External ear normal.     Left Ear: External ear normal.  Nose: Nose normal.     Mouth/Throat:     Mouth: Mucous membranes are moist.     Pharynx: Oropharynx is clear.  Eyes:     General: No scleral icterus.    Extraocular Movements: Extraocular movements intact.     Pupils: Pupils are equal, round, and reactive to light.  Cardiovascular:     Rate and Rhythm: Normal rate and regular rhythm.     Heart sounds: Normal heart sounds. No murmur heard. No friction rub. No gallop.   Pulmonary:     Effort: Pulmonary effort is normal. No respiratory distress.     Breath sounds: Normal breath sounds. No stridor. No wheezing, rhonchi or rales.  Neurological:     Mental Status: He is alert and oriented to person, place, and time.  Psychiatric:        Mood and Affect: Mood normal.        Behavior: Behavior normal.        Thought Content: Thought content normal.        Judgment: Judgment  normal.     ED ECG REPORT   Date: 01/29/2021  Rate: 64bpm  Rhythm: normal sinus rhythm  QRS Axis: normal  Intervals: normal  ST/T Wave abnormalities: nonspecific T wave changes  Conduction Disutrbances:none  Narrative Interpretation: Sinus rhythm at 64 bpm with nonspecific T wave inversion in lead aVL, V1, V2.  No previous EKG for comparison.  Old EKG Reviewed: none available  I have personally reviewed the EKG tracing and agree with the computerized printout as noted.   Assessment and Plan :   PDMP not reviewed this encounter.  1. Atypical chest pain     Patient is a 55 year old male with past medical history of COPD, tobacco abuse alcoholism presenting for moderate left-sided chest pain.  Concern is for an acute cardiopulmonary event, has nonspecific T wave inversion in leads as above.  No specific signs of ACS on EKG.  Discussed this with patient and his wife.  Recommended transport to the hospital by personal vehicle for further cardiac work-up.  No medications were provided prior to discharge.   Jaynee Eagles, PA-C 01/29/21 1056

## 2021-01-29 NOTE — ED Provider Notes (Signed)
[facility] Provider Note   CSN: [id number] Arrival date & time: 01/29/21  1104     History Chief Complaint  Patient presents with  . Chest Pain    Billy Thomas is a 55 y.o. male.  HPI   Patient with significant medical history of COPD, GERD, sleep apnea presents with chief complaint of left-sided chest and shoulder pain.  He endorses that pain initially started 3 days ago on his left scapula and left arm, he had worsening pain in that area and also developed some paresthesias underneath left arm and armpit.  This morning while he was going to work he was just not feeling well, then got into the car and started to have shortness of breath, felt as if he is going to pass out. He denies becoming diaphoretic, shortness of breath, nausea, vomiting, lightheadedness or dizziness.  Patient has no cardiac history, no history of PEs or DVTs, currently not on hormone therapy..  Patient does have a 35-pack-year smoking history, occasionally drinks alcohol, denies illicit drug use.  Patient denies any alleviating factors.  Patient denies headaches, fevers, chills, abdominal pain, nausea, vomiting, diarrhea, worsening pedal edema. Past Medical History:  Diagnosis Date  . COPD (chronic obstructive pulmonary disease) ([place])    possible; pt states unconfirmed  . GERD (gastroesophageal reflux disease)   . History of kidney stones   . Sleep apnea     Patient Active Problem List   Diagnosis Date Noted  . Elevated glucose 09/21/2018  . History of kidney stones 09/07/2018  . Healthcare maintenance 07/21/2017  . Candidiasis of mouth 05/05/2016  . Edema tongue 05/05/2016  . Benign prostatic hyperplasia 04/23/2016  . GERD (gastroesophageal reflux disease) 04/23/2016  . OSA on CPAP 04/23/2016  . Tobacco abuse 04/23/2016  . Tobacco abuse counseling 04/23/2016  . Obesity 04/23/2016  . COPD mixed type ([place]) 04/23/2016  . Alcoholism in remission ([place]) 04/23/2016   . GAD (generalized anxiety disorder) 04/22/2016  . Panic attack 04/22/2016  . Alcoholism ([place]) 05/05/1981    Past Surgical History:  Procedure Laterality Date  . APPENDECTOMY    . CARDIAC CATHETERIZATION  2010  . RHINOPLASTY    . SHOULDER SURGERY Bilateral 10 years ago       Family History  Problem Relation Age of Onset  . Cancer Father        lung  . Heart attack Paternal Grandfather   . Heart disease Paternal Grandfather     Social History   Tobacco Use  . Smoking status: Current Every Day Smoker    Packs/day: 1.00    Years: 32.00    Pack years: 32.00  . Smokeless tobacco: Never Used  Vaping Use  . Vaping Use: Never used  Substance Use Topics  . Alcohol use: No  . Drug use: No    Home Medications Prior to Admission medications   Medication Sig Start Date End Date Taking? Authorizing Provider  cyclobenzaprine (FLEXERIL) 10 MG tablet Take 1 tablet (10 mg total) by mouth 2 (two) times daily as needed for muscle spasms. 01/29/21  Yes Marcello Fennel, PA-C  ALPRAZolam Duanne Moron) 0.25 MG tablet Take 1 tablet (0.25 mg total) by mouth daily as needed. 12/11/20   Dutch Quint B, FNP  BEVESPI AEROSPHERE 9-4.8 MCG/ACT AERO INHALE 2 PUFFS INTO THE LUNGS 2 TIMES A DAY 09/16/19   Young, Tarri Fuller D, MD  HYDROcodone-acetaminophen (NORCO) 5-325 MG tablet Take 1 tablet by mouth every 6 (six) hours as needed for  up to 5 days for severe pain. 01/28/21 02/02/21  Scheeler, Carola Rhine, PA-C  omeprazole (PRILOSEC) 10 MG capsule Take 10 mg by mouth daily.    [provider]  ondansetron (ZOFRAN) 4 MG tablet Take 1 tablet (4 mg total) by mouth every 8 (eight) hours as needed for nausea or vomiting. 01/28/21   Scheeler, Carola Rhine, PA-C  tamsulosin (FLOMAX) 0.4 MG CAPS capsule Take 1 capsule (0.4 mg total) by mouth daily. 12/11/20   Kennyth Arnold, FNP  VENTOLIN HFA 108 [phone] Base) MCG/ACT inhaler INHALE 2 PUFFS 2 TIMES DAILY AS NEEDED 03/02/20   Deneise Lever, MD    Allergies     Hydrocodone  Review of Systems   Review of Systems  Constitutional: Negative for chills and fever.  HENT: Negative for congestion.   Respiratory: Negative for shortness of breath.   Cardiovascular: Positive for chest pain.  Gastrointestinal: Negative for abdominal pain, diarrhea, nausea and vomiting.  Genitourinary: Negative for dysuria and enuresis.  Musculoskeletal: Negative for back pain.       Left arm pain  Skin: Negative for rash.  Neurological: Positive for numbness. Negative for dizziness.  Hematological: Does not bruise/bleed easily.    Physical Exam Updated Vital Signs BP 116/76   Pulse 62   Temp 97.7 F (36.5 C)   Resp 16   Ht 6' (1.829 m)   Wt 102.5 kg   SpO2 97%   BMI 30.65 kg/m   Physical Exam Vitals and nursing note reviewed.  Constitutional:      General: He is not in acute distress.    Appearance: He is not ill-appearing.  HENT:     Head: Normocephalic and atraumatic.     Nose: No congestion.  Eyes:     Extraocular Movements: Extraocular movements intact.     Conjunctiva/sclera: Conjunctivae normal.     Pupils: Pupils are equal, round, and reactive to light.  Cardiovascular:     Rate and Rhythm: Normal rate and regular rhythm.     Pulses: Normal pulses.     Heart sounds: No murmur heard. No friction rub. No gallop.   Pulmonary:     Effort: No respiratory distress.     Breath sounds: No wheezing, rhonchi or rales.  Abdominal:     Palpations: Abdomen is soft.     Tenderness: There is no abdominal tenderness.  Musculoskeletal:        General: Tenderness present.     Right lower leg: No edema.     Left lower leg: No edema.     Comments: Patient noticed tenderness along his left deltoid as well as within his left axillary, he had full range of motion at his shoulder, elbow, wrist, neurovascular fully intact.  Skin:    General: Skin is warm and dry.  Neurological:     Mental Status: He is alert.     GCS: GCS eye subscore is 4. GCS verbal  subscore is 5.     Cranial Nerves: No facial asymmetry.     Sensory: Sensation is intact.     Motor: No weakness.     Coordination: Romberg sign negative. Finger-Nose-Finger Test normal.     Comments: Cranial nerves II through XII are grossly intact.  Patient have no difficulty word finding.  Psychiatric:        Mood and Affect: Mood normal.     ED Results / Procedures / Treatments   Labs (all labs ordered are listed, but only abnormal results are displayed)  Labs Reviewed  BASIC METABOLIC PANEL - Abnormal; Notable for the following components:      Result Value   Glucose, Bld 152 (*)    All other components within normal limits  CBC  TROPONIN I (HIGH SENSITIVITY)  TROPONIN I (HIGH SENSITIVITY)    EKG EKG Interpretation  Date/Time:  Tuesday January 29 2021 11:09:27 EDT Ventricular Rate:  67 PR Interval:  162 QRS Duration: 100 QT Interval:  406 QTC Calculation: 429 R Axis:   78 Text Interpretation: Normal sinus rhythm Normal ECG Confirmed by Lacretia Leigh ([id number]) on 01/29/2021 1:52:52 PM   Radiology DG Chest 2 View  Result Date: 01/29/2021 CLINICAL DATA:  Chest pain. EXAM: CHEST - 2 VIEW COMPARISON:  August 26, 2018. FINDINGS: The heart size and mediastinal contours are within normal limits. Both lungs are clear. Similar hyperinflation. No visible pleural effusions or pneumothorax. No acute osseous abnormality. IMPRESSION: 1. No active cardiopulmonary disease. 2. Similar hyperinflation. Electronically Signed   By: Margaretha Sheffield MD   On: 01/29/2021 12:46    Procedures Procedures   Medications Ordered in ED Medications - No data to display  ED Course  I have reviewed the triage vital signs and the nursing notes.  Pertinent labs & imaging results that were available during my care of the patient were reviewed by me and considered in my medical decision making (see chart for details).    MDM Rules/Calculators/A&P                         Initial  impression-patient presents with left-sided chest pain left arm pain.  He is alert, does not appear acute distress, vital signs reassuring.  Will obtain chest pain work-up, and reevaluate.  Work-up-CBC unremarkable, BMP shows slight hyperglycemia 152, first troponin was a 2, second opponent was a 3 chest x-ray negative for acute findings.  EKG sinus without signs of ischemia  Reassessment patient was reassessed, updated on lab work and imaging, patient has no complaints at this time.  Vital signs remained stable.  Patient is agreeable for discharge at this time.  Rule out-suspicion for CVA intracranial head bleed as is no neuro deficits on my exam.  Patient has a heart score of 2,  I have low suspicion for ACS as history is atypical, patient has no cardiac history, EKG was sinus rhythm without signs of ischemia, patient had a delta troponin.  Low suspicion for PE as patient denies pleuritic chest pain,patient denies leg pain, no pedal edema noted on exam, patient has low risk factors. Low suspicion for AAA or aortic dissection as history is atypical, patient has low risk factors.  Low suspicion for systemic infection as patient is nontoxic-appearing, vital signs reassuring, no obvious source infection noted on exam.  Plan-  1. chest pain with unknown etiology suspect this is secondary due to muscular strain but differential diagnosis includes costochondritis, anxiety, GERD, esophagus spasms, cervical angina.  Will recommend over-the-counter pain medications, follow-up with PCP for further evaluation.  Vital signs have remained stable, no indication for hospital admission.  Patient discussed with attending and they agreed with assessment and plan.  Patient given at home care as well strict return precautions.  Patient verbalized that they understood agreed to said plan.   Final Clinical Impression(s) / ED Diagnoses Final diagnoses:  Atypical chest pain    Rx / DC Orders ED Discharge Orders          Ordered    cyclobenzaprine (FLEXERIL) 10  MG tablet  2 times daily PRN        01/29/21 1523           Aron Baba 01/29/21 1532    Lacretia Leigh, MD 01/30/21 [phone]

## 2021-01-29 NOTE — ED Provider Notes (Signed)
I provided a substantive portion of the care of this patient.  I personally performed the entirety of the medical decision making for this encounter.  EKG Interpretation  Date/Time:  Tuesday January 29 2021 11:09:27 EDT Ventricular Rate:  67 PR Interval:  162 QRS Duration: 100 QT Interval:  406 QTC Calculation: 429 R Axis:   78 Text Interpretation: Normal sinus rhythm Normal ECG Confirmed by Lacretia Leigh ([id number]) on 01/29/2021 1:63:53 PM  55 year old male presents with several days of left back pain that radiates to his left axilla.  Denies any exertional chest discomfort.  No dyspnea or diaphoresis.  Pain is not worse with certain movements.  Has not been pleuritic.  Chest x-ray, delta troponin, EKG all reassuring.  Suspect musculoskeletal etiology and patient stable for discharge   Lacretia Leigh, MD 01/29/21 1516

## 2021-01-29 NOTE — ED Notes (Signed)
Pt transported to Xray at this time.

## 2021-01-30 ENCOUNTER — Other Ambulatory Visit (HOSPITAL_COMMUNITY)
Admission: RE | Admit: 2021-01-30 | Discharge: 2021-01-30 | Disposition: A | Discharge status: Home / Self Care | Payer: 59 | Source: Ambulatory Visit | Attending: Plastic Surgery | Admitting: Plastic Surgery

## 2021-01-30 DIAGNOSIS — Z20822 Contact with and (suspected) exposure to covid-19: Secondary | ICD-10-CM | POA: Diagnosis not present

## 2021-01-30 DIAGNOSIS — Z01812 Encounter for preprocedural laboratory examination: Secondary | ICD-10-CM | POA: Diagnosis present

## 2021-01-30 LAB — SARS CORONAVIRUS 2 (TAT 6-24 HRS): SARS Coronavirus 2: NEGATIVE

## 2021-02-01 ENCOUNTER — Ambulatory Visit (HOSPITAL_BASED_OUTPATIENT_CLINIC_OR_DEPARTMENT_OTHER)
Admission: RE | Admit: 2021-02-01 | Discharge: 2021-02-01 | Disposition: A | Discharge status: Home / Self Care | Payer: 59 | Source: Ambulatory Visit | Attending: Plastic Surgery | Admitting: Plastic Surgery

## 2021-02-01 ENCOUNTER — Other Ambulatory Visit: Payer: Self-pay

## 2021-02-01 ENCOUNTER — Encounter (HOSPITAL_BASED_OUTPATIENT_CLINIC_OR_DEPARTMENT_OTHER)
Admission: RE | Disposition: A | Discharge status: Home / Self Care | Payer: Self-pay | Source: Ambulatory Visit | Attending: Plastic Surgery

## 2021-02-01 ENCOUNTER — Encounter (HOSPITAL_BASED_OUTPATIENT_CLINIC_OR_DEPARTMENT_OTHER): Payer: Self-pay | Admitting: Plastic Surgery

## 2021-02-01 ENCOUNTER — Ambulatory Visit (HOSPITAL_BASED_OUTPATIENT_CLINIC_OR_DEPARTMENT_OTHER): Payer: 59 | Admitting: Anesthesiology

## 2021-02-01 DIAGNOSIS — H02834 Dermatochalasis of left upper eyelid: Secondary | ICD-10-CM

## 2021-02-01 DIAGNOSIS — H02835 Dermatochalasis of left lower eyelid: Secondary | ICD-10-CM

## 2021-02-01 DIAGNOSIS — F17210 Nicotine dependence, cigarettes, uncomplicated: Secondary | ICD-10-CM | POA: Diagnosis not present

## 2021-02-01 DIAGNOSIS — Z79899 Other long term (current) drug therapy: Secondary | ICD-10-CM | POA: Diagnosis not present

## 2021-02-01 DIAGNOSIS — Z885 Allergy status to narcotic agent status: Secondary | ICD-10-CM | POA: Insufficient documentation

## 2021-02-01 DIAGNOSIS — H02831 Dermatochalasis of right upper eyelid: Secondary | ICD-10-CM

## 2021-02-01 DIAGNOSIS — H02832 Dermatochalasis of right lower eyelid: Secondary | ICD-10-CM

## 2021-02-01 HISTORY — DX: Gastro-esophageal reflux disease without esophagitis: K21.9

## 2021-02-01 HISTORY — PX: BROW LIFT: SHX178

## 2021-02-01 HISTORY — DX: Sleep apnea, unspecified: G47.30

## 2021-02-01 HISTORY — DX: Chronic obstructive pulmonary disease, unspecified: J44.9

## 2021-02-01 HISTORY — DX: Personal history of urinary calculi: Z87.442

## 2021-02-01 SURGERY — BLEPHAROPLASTY
Anesthesia: General | Site: Eye | Laterality: Bilateral

## 2021-02-01 MED ORDER — BACITRACIN-POLYMYXIN B 500-10000 UNIT/GM OP OINT
TOPICAL_OINTMENT | OPHTHALMIC | Status: AC
  Filled 2021-02-01: qty 3.5

## 2021-02-01 MED ORDER — LIDOCAINE-EPINEPHRINE 1 %-1:100000 IJ SOLN
INTRAMUSCULAR | Status: DC | PRN
  Administered 2021-02-01: 16 mL

## 2021-02-01 MED ORDER — MIDAZOLAM HCL 2 MG/2ML IJ SOLN
INTRAMUSCULAR | Status: AC
  Filled 2021-02-01: qty 2

## 2021-02-01 MED ORDER — LIDOCAINE-EPINEPHRINE 1 %-1:100000 IJ SOLN
INTRAMUSCULAR | Status: AC
  Filled 2021-02-01: qty 1

## 2021-02-01 MED ORDER — FENTANYL CITRATE (PF) 100 MCG/2ML IJ SOLN
INTRAMUSCULAR | Status: DC | PRN
  Administered 2021-02-01 (×2): 50 ug via INTRAVENOUS

## 2021-02-01 MED ORDER — LIDOCAINE 2% (20 MG/ML) 5 ML SYRINGE
INTRAMUSCULAR | Status: AC
  Filled 2021-02-01: qty 5

## 2021-02-01 MED ORDER — PHENYLEPHRINE 40 MCG/ML (10ML) SYRINGE FOR IV PUSH (FOR BLOOD PRESSURE SUPPORT)
PREFILLED_SYRINGE | INTRAVENOUS | Status: AC
  Filled 2021-02-01: qty 10

## 2021-02-01 MED ORDER — ONDANSETRON HCL 4 MG/2ML IJ SOLN
INTRAMUSCULAR | Status: AC
  Filled 2021-02-01: qty 2

## 2021-02-01 MED ORDER — SUCCINYLCHOLINE CHLORIDE 200 MG/10ML IV SOSY
PREFILLED_SYRINGE | INTRAVENOUS | Status: AC
  Filled 2021-02-01: qty 10

## 2021-02-01 MED ORDER — PROPOFOL 10 MG/ML IV BOLUS
INTRAVENOUS | Status: DC | PRN
  Administered 2021-02-01: 200 mg via INTRAVENOUS

## 2021-02-01 MED ORDER — BSS IO SOLN
INTRAOCULAR | Status: AC
  Filled 2021-02-01: qty 15

## 2021-02-01 MED ORDER — DEXAMETHASONE SODIUM PHOSPHATE 4 MG/ML IJ SOLN
INTRAMUSCULAR | Status: DC | PRN
  Administered 2021-02-01: 10 mg via INTRAVENOUS

## 2021-02-01 MED ORDER — DEXMEDETOMIDINE (PRECEDEX) IN NS 20 MCG/5ML (4 MCG/ML) IV SYRINGE
PREFILLED_SYRINGE | INTRAVENOUS | Status: AC
  Filled 2021-02-01: qty 5

## 2021-02-01 MED ORDER — FENTANYL CITRATE (PF) 100 MCG/2ML IJ SOLN: 25.0000 ug | INTRAMUSCULAR | Status: DC | PRN

## 2021-02-01 MED ORDER — DEXMEDETOMIDINE HCL IN NACL 200 MCG/50ML IV SOLN
INTRAVENOUS | Status: DC | PRN
  Administered 2021-02-01: 16 ug via INTRAVENOUS

## 2021-02-01 MED ORDER — DIPHENHYDRAMINE HCL 50 MG/ML IJ SOLN
INTRAMUSCULAR | Status: DC | PRN
  Administered 2021-02-01: 6.25 mg via INTRAVENOUS

## 2021-02-01 MED ORDER — DEXAMETHASONE SODIUM PHOSPHATE 10 MG/ML IJ SOLN
INTRAMUSCULAR | Status: AC
  Filled 2021-02-01: qty 1

## 2021-02-01 MED ORDER — TOBRAMYCIN-DEXAMETHASONE 0.3-0.1 % OP OINT
TOPICAL_OINTMENT | OPHTHALMIC | Status: AC
  Filled 2021-02-01: qty 3.5

## 2021-02-01 MED ORDER — BSS IO SOLN
INTRAOCULAR | Status: DC | PRN
  Administered 2021-02-01: 30 mL via INTRAOCULAR

## 2021-02-01 MED ORDER — FENTANYL CITRATE (PF) 100 MCG/2ML IJ SOLN
INTRAMUSCULAR | Status: AC
  Filled 2021-02-01: qty 2

## 2021-02-01 MED ORDER — EPHEDRINE 5 MG/ML INJ
INTRAVENOUS | Status: AC
  Filled 2021-02-01: qty 10

## 2021-02-01 MED ORDER — TOBRAMYCIN 0.3 % OP OINT
TOPICAL_OINTMENT | OPHTHALMIC | Status: DC | PRN
  Administered 2021-02-01: 1 via OPHTHALMIC

## 2021-02-01 MED ORDER — OXYCODONE HCL 5 MG PO TABS
ORAL_TABLET | ORAL | Status: AC
  Filled 2021-02-01: qty 1

## 2021-02-01 MED ORDER — OXYCODONE HCL 5 MG PO TABS
5.0000 mg | ORAL_TABLET | Freq: Once | ORAL | Status: AC
Start: 2021-02-01 — End: 2021-02-01
  Administered 2021-02-01: 5 mg via ORAL

## 2021-02-01 MED ORDER — DIPHENHYDRAMINE HCL 50 MG/ML IJ SOLN
INTRAMUSCULAR | Status: AC
  Filled 2021-02-01: qty 1

## 2021-02-01 MED ORDER — LIDOCAINE HCL (CARDIAC) PF 100 MG/5ML IV SOSY
PREFILLED_SYRINGE | INTRAVENOUS | Status: DC | PRN
  Administered 2021-02-01: 80 mg via INTRAVENOUS

## 2021-02-01 MED ORDER — CEFAZOLIN SODIUM-DEXTROSE 2-4 GM/100ML-% IV SOLN
INTRAVENOUS | Status: AC
  Filled 2021-02-01: qty 100

## 2021-02-01 MED ORDER — MIDAZOLAM HCL 5 MG/5ML IJ SOLN
INTRAMUSCULAR | Status: DC | PRN
  Administered 2021-02-01: 2 mg via INTRAVENOUS

## 2021-02-01 MED ORDER — CEFAZOLIN SODIUM-DEXTROSE 2-4 GM/100ML-% IV SOLN
2.0000 g | INTRAVENOUS | Status: AC
  Administered 2021-02-01: 2 g via INTRAVENOUS

## 2021-02-01 MED ORDER — PROMETHAZINE HCL 25 MG/ML IJ SOLN: 6.2500 mg | INTRAMUSCULAR | Status: DC | PRN

## 2021-02-01 MED ORDER — ACETAMINOPHEN 10 MG/ML IV SOLN
1000.0000 mg | Freq: Once | INTRAVENOUS | Status: DC | PRN
  Administered 2021-02-01: 1000 mg via INTRAVENOUS

## 2021-02-01 MED ORDER — LACTATED RINGERS IV SOLN: INTRAVENOUS | Status: DC

## 2021-02-01 MED ORDER — ACETAMINOPHEN 10 MG/ML IV SOLN
INTRAVENOUS | Status: AC
  Filled 2021-02-01: qty 100

## 2021-02-01 SURGICAL SUPPLY — 51 items
ADH SKN CLS APL DERMABOND .7 (GAUZE/BANDAGES/DRESSINGS)
APL SRG 3 HI ABS STRL LF PLS (MISCELLANEOUS)
APL SWBSTK 6 STRL LF DISP (MISCELLANEOUS) ×1
APPLICATOR COTTON TIP 6 STRL (MISCELLANEOUS) ×1 IMPLANT
APPLICATOR COTTON TIP 6IN STRL (MISCELLANEOUS) ×2
APPLICATOR DR MATTHEWS STRL (MISCELLANEOUS) IMPLANT
BLADE SURG 15 STRL LF DISP TIS (BLADE) ×1 IMPLANT
BLADE SURG 15 STRL SS (BLADE) ×2
BNDG EYE OVAL (GAUZE/BANDAGES/DRESSINGS) ×4 IMPLANT
COVER BACK TABLE 60X90IN (DRAPES) ×2 IMPLANT
COVER MAYO STAND STRL (DRAPES) ×2 IMPLANT
COVER WAND RF STERILE (DRAPES) IMPLANT
DECANTER SPIKE VIAL GLASS SM (MISCELLANEOUS) IMPLANT
DERMABOND ADVANCED (GAUZE/BANDAGES/DRESSINGS)
DERMABOND ADVANCED .7 DNX12 (GAUZE/BANDAGES/DRESSINGS) IMPLANT
DRAPE U-SHAPE 76X120 STRL (DRAPES) ×2 IMPLANT
ELECT NDL BLADE 2-5/6 (NEEDLE) ×1 IMPLANT
ELECT NEEDLE BLADE 2-5/6 (NEEDLE) ×2 IMPLANT
ELECT REM PT RETURN 9FT ADLT (ELECTROSURGICAL) ×2
ELECTRODE REM PT RTRN 9FT ADLT (ELECTROSURGICAL) ×1 IMPLANT
GLOVE SRG 8 PF TXTR STRL LF DI (GLOVE) ×1 IMPLANT
GLOVE SURG ENC MOIS LTX SZ6.5 (GLOVE) ×1 IMPLANT
GLOVE SURG ENC MOIS LTX SZ7.5 (GLOVE) IMPLANT
GLOVE SURG ENC TEXT LTX SZ7.5 (GLOVE) ×2 IMPLANT
GLOVE SURG POLYISO LF SZ6.5 (GLOVE) ×1 IMPLANT
GLOVE SURG POLYISO LF SZ8 (GLOVE) ×1 IMPLANT
GLOVE SURG UNDER POLY LF SZ6.5 (GLOVE) ×2 IMPLANT
GLOVE SURG UNDER POLY LF SZ7 (GLOVE) ×1 IMPLANT
GLOVE SURG UNDER POLY LF SZ8 (GLOVE) ×2
GOWN STRL REUS W/ TWL LRG LVL3 (GOWN DISPOSABLE) ×2 IMPLANT
GOWN STRL REUS W/TWL 2XL LVL3 (GOWN DISPOSABLE) ×1 IMPLANT
GOWN STRL REUS W/TWL LRG LVL3 (GOWN DISPOSABLE) ×6
NDL HYPO 30GX1 BEV (NEEDLE) IMPLANT
NDL PRECISIONGLIDE 27X1.5 (NEEDLE) IMPLANT
NEEDLE HYPO 30GX1 BEV (NEEDLE) IMPLANT
NEEDLE PRECISIONGLIDE 27X1.5 (NEEDLE) ×2 IMPLANT
PACK BASIN DAY SURGERY FS (CUSTOM PROCEDURE TRAY) ×2 IMPLANT
PENCIL SMOKE EVACUATOR (MISCELLANEOUS) ×2 IMPLANT
SHEILD EYE MED CORNL SHD 22X21 (OPHTHALMIC RELATED) ×4
SHIELD EYE MED CORNL SHD 22X21 (OPHTHALMIC RELATED) ×2 IMPLANT
SLEEVE SCD COMPRESS KNEE MED (STOCKING) IMPLANT
STRIP CLOSURE SKIN 1/2X4 (GAUZE/BANDAGES/DRESSINGS) ×2 IMPLANT
STRIP SUTURE WOUND CLOSURE 1/2 (MISCELLANEOUS) IMPLANT
SUT MNCRL 6-0 UNDY P1 1X18 (SUTURE) IMPLANT
SUT MONOCRYL 6-0 P1 1X18 (SUTURE)
SUT PLAIN 5 0 P 3 18 (SUTURE) ×3 IMPLANT
SUT PROLENE 5 0 P 3 (SUTURE) ×1 IMPLANT
SUT PROLENE 6 0 P 1 18 (SUTURE) IMPLANT
SYR CONTROL 10ML LL (SYRINGE) ×2 IMPLANT
TOWEL GREEN STERILE FF (TOWEL DISPOSABLE) ×3 IMPLANT
TRAY DSU PREP LF (CUSTOM PROCEDURE TRAY) ×2 IMPLANT

## 2021-02-01 NOTE — Interval H&P Note (Signed)
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  The various methods of treatment have been discussed with the patient and family. After consideration of risks, benefits and other options for treatment, the patient has consented to  Procedure(s) with comments: BLEPHAROPLASTY (Bilateral) - 1 hour as a surgical intervention.  The patient's history has been reviewed, patient examined, no change in status, stable for surgery.  I have reviewed the patient's chart and labs.  Questions were answered to the patient's satisfaction.     Cindra Presume

## 2021-02-01 NOTE — Anesthesia Postprocedure Evaluation (Signed)
Anesthesia Post Note  Patient: Billy Thomas  Procedure(s) Performed: BLEPHAROPLASTY (Bilateral Eye)     Patient location during evaluation: PACU Anesthesia Type: General Level of consciousness: awake and alert Pain management: pain level controlled Vital Signs Assessment: post-procedure vital signs reviewed and stable Respiratory status: spontaneous breathing, nonlabored ventilation, respiratory function stable and patient connected to nasal cannula oxygen Cardiovascular status: blood pressure returned to baseline and stable Postop Assessment: no apparent nausea or vomiting Anesthetic complications: no   No complications documented.  Last Vitals:  Vitals:   02/01/21 1150 02/01/21 1225  BP:  121/78  Pulse: 70 69  Resp: 15 18  Temp:  36.8 C  SpO2: 96% 93%    Last Pain:  Vitals:   02/01/21 1225  TempSrc:   PainSc: 6                  Nicki Gracy P Anberlyn Feimster

## 2021-02-01 NOTE — Anesthesia Procedure Notes (Signed)
Procedure Name: LMA Insertion Date/Time: 02/01/2021 10:03 AM Performed by: Willa Frater, CRNA Pre-anesthesia Checklist: Patient identified, Emergency Drugs available, Suction available and Patient being monitored Patient Re-evaluated:Patient Re-evaluated prior to induction Oxygen Delivery Method: Circle system utilized Preoxygenation: Pre-oxygenation with 100% oxygen Induction Type: IV induction Ventilation: Mask ventilation without difficulty LMA: LMA flexible inserted LMA Size: 5.0 Number of attempts: 1 Airway Equipment and Method: Bite block Placement Confirmation: positive ETCO2 Tube secured with: Tape Dental Injury: Teeth and Oropharynx as per pre-operative assessment

## 2021-02-01 NOTE — Brief Op Note (Signed)
02/01/2021  11:15 AM  PATIENT:  Billy Thomas  55 y.o. 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   Delay start of Pharmacological VTE agent (>24hrs) due to surgical blood loss or risk of bleeding: not applicable

## 2021-02-01 NOTE — Transfer of Care (Signed)
Immediate Anesthesia Transfer of Care Note  Patient: Billy Thomas  Procedure(s) Performed: BLEPHAROPLASTY (Bilateral Eye)  Patient Location: PACU  Anesthesia Type:General  Level of Consciousness: sedated  Airway & Oxygen Therapy: Patient Spontanous Breathing and Patient connected to face mask oxygen  Post-op Assessment: Report given to RN and Post -op Vital signs reviewed and stable  Post vital signs: Reviewed and stable  Last Vitals:  Vitals Value Taken Time  BP    Temp    Pulse 84 02/01/21 1130  Resp 9 02/01/21 1130  SpO2 94 % 02/01/21 1130  Vitals shown include unvalidated device data.  Last Pain:  Vitals:   02/01/21 0902  TempSrc: Oral  PainSc: 1          Complications: No complications documented.

## 2021-02-01 NOTE — Discharge Instructions (Signed)
Activity: As tolerated, but avoid strenuous activity until follow up visit.  Diet: Regular  Wound Care: Apply provided ointment twice daily.  Can shower normally.  Apply ice regularly to reduce bruising and swelling.  Special Instructions:  Call our office if any unusual problems occur such as pain, excessive bleeding, unrelieved nausea/vomiting, fever &/or chills.  Follow-up appointment: Scheduled for next week.  Post Anesthesia Home Care Instructions  Activity: Get plenty of rest for the remainder of the day. A responsible individual must stay with you for 24 hours following the procedure.  For the next 24 hours, DO NOT: -Drive a car -Paediatric nurse -Drink alcoholic beverages -Take any medication unless instructed by your physician -Make any legal decisions or sign important papers.  Meals: Start with liquid foods such as gelatin or soup. Progress to regular foods as tolerated. Avoid greasy, spicy, heavy foods. If nausea and/or vomiting occur, drink only clear liquids until the nausea and/or vomiting subsides. Call your physician if vomiting continues.  Special Instructions/Symptoms: Your throat may feel dry or sore from the anesthesia or the breathing tube placed in your throat during surgery. If this causes discomfort, gargle with warm salt water. The discomfort should disappear within 24 hours.     Next dose of Tylenol can be given at 6:00pm. Oxycodone given at 12:00. HYDROcodone-acetaminophen can be given at 6:00pm if needed.

## 2021-02-01 NOTE — Op Note (Signed)
Operative Note   DATE OF OPERATION: 02/01/2021  SURGICAL DEPARTMENT: Plastic Surgery  PREOPERATIVE DIAGNOSES: Bilateral upper lid dermatochalasis  POSTOPERATIVE DIAGNOSES:  same  PROCEDURE: Bilateral upper lid blepharoplasty  SURGEON: Talmadge Coventry, MD  ASSISTANT: [place], RNFA The advanced practice practitioner (APP) assisted throughout the case.  The APP was essential in retraction and counter traction when needed to make the case progress smoothly.  This retraction and assistance made it possible to see the tissue plans for the procedure.  The assistance was needed for blood control, tissue re-approximation and assisted with closure of the incision site.   ANESTHESIA:  General.   COMPLICATIONS: None.   INDICATIONS FOR PROCEDURE:  The patient, Billy Thomas is a 55 y.o. male born on Nov 08, 1966, is here for treatment of symptomatic bilateral upper lid dermatochalasis MRN: [id number]  CONSENT:  Informed consent was obtained directly from the patient. Risks, benefits and alternatives were fully discussed. Specific risks including but not limited to bleeding, infection, hematoma, seroma, scarring, pain, contracture, asymmetry, wound healing problems, and need for further surgery were all discussed. The patient did have an ample opportunity to have questions answered to satisfaction.   DESCRIPTION OF PROCEDURE:  The patient was taken to the operating room. SCDs were placed and antibiotics were given.  General anesthesia was administered.  The patient's operative site was prepped and draped in a sterile fashion. A time out was performed and all information was confirmed to be correct.  I started by marking out my incisions.  His upper lid crease was 9 mm superior to the eyelid margin on both sides.  This was the starting point for the inferior mark.  I then marked out 11 mm of skin to be left behind beneath the eyebrow and this made the starting point for the superior mark.  This  was extended medially to about the level of the punctum and laterally to about 1.7 cm lateral to the lateral canthus.  This was then evaluated to ensure appropriate symmetry.  Corneal protectors were then placed.  Lidocaine with epinephrine was then injected with a 30-gauge needle on each side.  This was given time to work.  The excisions were then done with a 15 blade performing skin only Bleph excision on both sides.  The nasal fat pad was then accessed and a conservative amount of fatty tissue was removed using slow electrocautery to ensure no deeper bleeding.  At this point meticulous hemostasis was obtained and both sides were closed with few interrupted 5-0 fast gut sutures in a running 5-0 fast gut.  Corneal protectors were then removed.  Both eyes were irrigated with BSS solution.  Ophthalmic ointment was applied.  The patient tolerated the procedure well.  There were no complications. The patient was allowed to wake from anesthesia, extubated and taken to the recovery room in satisfactory condition.

## 2021-02-01 NOTE — Anesthesia Preprocedure Evaluation (Signed)
Anesthesia Evaluation  Patient identified by MRN, date of birth, ID band Patient awake    Reviewed: Allergy & Precautions, NPO status , Patient's Chart, lab work & pertinent test results  Airway Mallampati: II  TM Distance: >3 FB Neck ROM: Full    Dental  (+) Teeth Intact   Pulmonary sleep apnea and Continuous Positive Airway Pressure Ventilation , COPD,  COPD inhaler, Current Smoker,    Pulmonary exam normal        Cardiovascular negative cardio ROS   Rhythm:Regular Rate:Normal     Neuro/Psych Anxiety negative neurological ROS     GI/Hepatic Neg liver ROS, GERD  Medicated,  Endo/Other  negative endocrine ROS  Renal/GU negative Renal ROS  negative genitourinary   Musculoskeletal   Abdominal (+)  Abdomen: soft. Bowel sounds: normal.  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  Reproductive/Obstetrics                             Anesthesia Physical Anesthesia Plan  ASA: II  Anesthesia Plan: General   Post-op Pain Management:    Induction: Intravenous  PONV Risk Score and Plan: 1 and Dexamethasone, Ondansetron, Midazolam and Treatment may vary due to age or medical condition  Airway Management Planned: Mask and LMA  Additional Equipment: None  Intra-op Plan:   Post-operative Plan: Extubation in OR  Informed Consent: I have reviewed the patients History and Physical, chart, labs and discussed the procedure including the risks, benefits and alternatives for the proposed anesthesia with the patient or authorized representative who has indicated his/her understanding and acceptance.     Dental advisory given  Plan Discussed with: CRNA  Anesthesia Plan Comments: (Lab Results      Component                Value               Date                      WBC                      6.7                  01/29/2021                HGB                      14.1                01/29/2021                HCT                      42.7                01/29/2021                MCV                      97.7                01/29/2021                PLT  216                 01/29/2021           Lab Results      Component                Value               Date                      NA                       139                 01/29/2021                K                        4.1                 01/29/2021                CO2                      26                  01/29/2021                GLUCOSE                  152 (H)             01/29/2021                BUN                      15                  01/29/2021                CREATININE               0.97                01/29/2021                CALCIUM                  9.0                 01/29/2021                GFRNONAA                 >60                 01/29/2021                GFRAA                    119                 03/02/2017          )        Anesthesia Quick Evaluation

## 2021-02-05 ENCOUNTER — Encounter (HOSPITAL_BASED_OUTPATIENT_CLINIC_OR_DEPARTMENT_OTHER): Payer: Self-pay | Admitting: Plastic Surgery

## 2021-02-07 ENCOUNTER — Ambulatory Visit (INDEPENDENT_AMBULATORY_CARE_PROVIDER_SITE_OTHER): Payer: 59 | Admitting: Plastic Surgery

## 2021-02-07 ENCOUNTER — Other Ambulatory Visit: Payer: Self-pay

## 2021-02-07 ENCOUNTER — Encounter: Payer: Self-pay | Admitting: Plastic Surgery

## 2021-02-07 VITALS — BP 120/65 | HR 71

## 2021-02-07 DIAGNOSIS — H02831 Dermatochalasis of right upper eyelid: Secondary | ICD-10-CM

## 2021-02-07 DIAGNOSIS — H02835 Dermatochalasis of left lower eyelid: Secondary | ICD-10-CM

## 2021-02-07 DIAGNOSIS — H02832 Dermatochalasis of right lower eyelid: Secondary | ICD-10-CM

## 2021-02-07 DIAGNOSIS — H02834 Dermatochalasis of left upper eyelid: Secondary | ICD-10-CM

## 2021-02-07 NOTE — Progress Notes (Signed)
Patient presents 1 week postop from bilateral upper lid blepharoplasty.  He feels good overall.  He did have a few days of pain but overall has not needed much pain medication at all.  On exam he looks to be healing appropriately.  He does have bruising and swelling.  He has slightly more bruising on the left side than the right.  His eyes show no signs of irritation and he reports normal vision.  The suture knots were removed and I will plan to see him again in about a month's time.  All of his questions were answered.

## 2021-03-20 ENCOUNTER — Ambulatory Visit: Payer: 59 | Admitting: Plastic Surgery

## 2021-04-21 ENCOUNTER — Encounter: Payer: Self-pay | Admitting: Internal Medicine

## 2021-04-22 ENCOUNTER — Ambulatory Visit: Payer: 59 | Admitting: Internal Medicine

## 2021-04-22 NOTE — Progress Notes (Signed)
HPI male smoker followed for OSA, COPD, BPH, anxiety, GERD, obesity, Office Spirometry 07/23/17-moderate obstructive airways disease. FVC 3.42/63%, FEV1 2.71/64%, ratio 0.79, FEF 25-75% 2.62/71% HST 08/03/17-AHI 12.2/hour, desaturation to 86%, body weight 232 pounds -------------------------------------------------------------------------------   12/27/2018- 55 year old male Smoker(32 pkyrs) followed for OSA, COPD, BPH, anxiety, GERD, obesity, CPAP auto 5-20/[facility]          AirSense 10 AutoSet -----CPAP machine and Bevespi working well for patient. Download 100% compliance AHI 1.2/hour Adderall, Bevespi He is very pleased with Bevespi and does not feel he has significant breathing concerns at this time.  Unfortunately he continues to smoke.  Feels well.  We discussed his CXR showing hyperinflation and he agrees to PFT. He is comfortable with CPAP with no complaints at all.  We reviewed download. CXR 08/26/2018- The chest is hyperexpanded but the lungs are clear. Heart size is normal. No pneumothorax or pleural fluid. No acute or focal bony abnormality. IMPRESSION: Appearance of the chest compatible with COPD.  No acute disease.  04/23/21- 55 year old male Smoker followed for OSA, COPD, BPH, Anxiety, GERD, obesity, - Bevespi, Ventolin hfa,  CPAP auto 5-20/[facility] Download- compliance 100%, AHI 1.4/ hr Body weight today-224 lbs Covid vax- -----Patient feels good overall, no concerns at this time Would like CPAP pressure to start a little higher. Download reviewed. Breathing feels stable- he is physically active. Has been out of inhalers over a week pending this appointment. Continues to smoke.  CXR 01/29/21- IMPRESSION: 1. No active cardiopulmonary disease. 2. Similar hyperinflation.   ROS-see HPI   + = positive Constitutional:    weight loss, night sweats, fevers, chills, fatigue, lassitude. HEENT:    headaches, difficulty swallowing, tooth/dental problems, sore throat,        sneezing, itching, ear ache, nasal congestion, post nasal drip, snoring CV:    chest pain, orthopnea, PND, swelling in lower extremities, anasarca,                                              dizziness, palpitations Resp:   + shortness of breath with exertion or at rest.                productive cough,   non-productive cough, coughing up of blood.              change in color of mucus.  wheezing.   Skin:    rash or lesions. GI:  No-   heartburn, indigestion, abdominal pain, nausea, vomiting, diarrhea,                 change in bowel habits, loss of appetite GU: dysuria, change in color of urine, no urgency or frequency.   flank pain. MS:   joint pain, stiffness, decreased range of motion, back pain. Neuro-     nothing unusual Psych:  change in mood or affect.  depression or +anxiety.   memory loss.  OBJ- Physical Exam General- Alert, Oriented, Affect-appropriate, Distress- none acute Skin- rash-none, lesions- none, excoriation- none Lymphadenopathy- none Head- atraumatic            Eyes- Gross vision intact, PERRLA, conjunctivae and secretions clear            Ears- Hearing, canals-normal            Nose- Clear, no-Septal dev, mucus, polyps, erosion, perforation  Throat- Mallampati IV , mucosa clear , drainage- none, tonsils- atrophic, Neck- flexible , trachea midline, no stridor , thyroid nl, carotid no bruit Chest - symmetrical excursion , unlabored           Heart/CV- RRR , no murmur , no gallop  , no rub, nl s1 s2                           - JVD- none , edema- none, stasis changes- none, varices- none           Lung- , wheeze+/unlabored, cough- none , dullness-none, rub- none           Chest wall-  Abd-  Br/ Gen/ Rectal- Not done, not indicated Extrem- cyanosis- none, clubbing, none, atrophy- none, strength- nl Neuro- grossly intact to observation

## 2021-04-23 ENCOUNTER — Ambulatory Visit (INDEPENDENT_AMBULATORY_CARE_PROVIDER_SITE_OTHER): Payer: 59 | Admitting: Internal Medicine

## 2021-04-23 ENCOUNTER — Other Ambulatory Visit: Payer: Self-pay

## 2021-04-23 ENCOUNTER — Encounter: Payer: Self-pay | Admitting: Internal Medicine

## 2021-04-23 VITALS — BP 112/60 | HR 66 | Temp 98.2°F | Ht 72.0 in | Wt 224.4 lb

## 2021-04-23 DIAGNOSIS — Z9989 Dependence on other enabling machines and devices: Secondary | ICD-10-CM

## 2021-04-23 DIAGNOSIS — G4733 Obstructive sleep apnea (adult) (pediatric): Secondary | ICD-10-CM | POA: Diagnosis not present

## 2021-04-23 DIAGNOSIS — Z720 Tobacco use: Secondary | ICD-10-CM | POA: Diagnosis not present

## 2021-04-23 DIAGNOSIS — J449 Chronic obstructive pulmonary disease, unspecified: Secondary | ICD-10-CM

## 2021-04-23 MED ORDER — BEVESPI AEROSPHERE 9-4.8 MCG/ACT IN AERO: INHALATION_SPRAY | RESPIRATORY_TRACT | 12 refills | Status: DC

## 2021-04-23 MED ORDER — ALBUTEROL SULFATE HFA 108 (90 BASE) MCG/ACT IN AERS
INHALATION_SPRAY | RESPIRATORY_TRACT | 12 refills | Status: DC
Start: 2021-04-23 — End: 2022-02-16

## 2021-04-23 NOTE — Assessment & Plan Note (Signed)
Benefits from CPAP with good compliance and control. He would like starting pressure a little higher. We reviewed download and discussed options. Plan- change auto range to 10-20

## 2021-04-23 NOTE — Assessment & Plan Note (Signed)
Again pressed to stop. Support available.

## 2021-04-23 NOTE — Patient Instructions (Addendum)
Order- DME [facility]- please change autopap range to 10-20, continue mask of choice, humidifier, supplies, airview/ card  Order- schedule PFT    Dx Tobacco user  Refill sent for your inhalers  Please call if we can help

## 2021-04-23 NOTE — Assessment & Plan Note (Signed)
Continues to smoke against advice. Wheeze today since he ran out of inhalers. Plan- refill inhalers, schedule PFT

## 2021-04-25 ENCOUNTER — Other Ambulatory Visit: Payer: Self-pay | Admitting: Family

## 2021-04-25 ENCOUNTER — Encounter: Payer: Self-pay | Admitting: Family Medicine

## 2021-04-26 ENCOUNTER — Other Ambulatory Visit: Payer: Self-pay

## 2021-04-26 MED ORDER — TAMSULOSIN HCL 0.4 MG PO CAPS: 0.4000 mg | ORAL_CAPSULE | Freq: Every day | ORAL | 0 refills | Status: DC

## 2021-04-26 NOTE — Telephone Encounter (Signed)
Called and spoke to pt who was upset about being advised 30 day supply was sent and appt needed. Pt states that he should only have to come 1x a year and it wasn't his problem that Dr. Ethelene Hal was out. He felt [facility] should order for a year or it should be continued by Dr. Ethelene Hal for a year. Pt declined scheduling an appointment and said "I'll take it from here."

## 2021-04-29 ENCOUNTER — Other Ambulatory Visit: Payer: Self-pay

## 2021-04-29 MED ORDER — TAMSULOSIN HCL 0.4 MG PO CAPS: 0.4000 mg | ORAL_CAPSULE | Freq: Every day | ORAL | 0 refills | Status: DC

## 2021-04-29 NOTE — Telephone Encounter (Signed)
Spoke to patient VIA phone and advised of Dr Bebe Shaggy recommendations.  Rx sent to the pharmacy. Dm/cma

## 2021-05-07 ENCOUNTER — Telehealth: Payer: Self-pay | Admitting: Internal Medicine

## 2021-05-07 DIAGNOSIS — G4733 Obstructive sleep apnea (adult) (pediatric): Secondary | ICD-10-CM

## 2021-05-07 NOTE — Telephone Encounter (Signed)
Please order DME [facility]- please change CPAP auto back to 5-20 Thanks

## 2021-05-07 NOTE — Telephone Encounter (Signed)
DME order placed to change cpap settings auto 5-20.  Patient is aware order has been placed.  Nothing further at this time.

## 2021-05-07 NOTE — Telephone Encounter (Signed)
Called and spoke with Patient.  Patient stated he requested Dr. Annamaria Boots increase cpap pressure. Patient stated cpap pressure is way to much and he is unable to sleep. Patient requested a new order be placed to [facility] to have cpap pressure return to previous settings.   Message routed to Dr. Annamaria Boots to advise

## 2021-05-27 ENCOUNTER — Telehealth: Payer: Self-pay | Admitting: Internal Medicine

## 2021-05-27 NOTE — Telephone Encounter (Signed)
Called and spoke with Billy Thomas from [facility] who states that she just talked to the patient this morning and the pressure settings have been taken care of. Called and spoke with patient who verified that it was taken care of this morning. Advised him to let us know if he needs anything else. Nothing further needed at this time.

## 2021-05-27 NOTE — Telephone Encounter (Signed)
Pt stated that he has tried to contact [facility] but they have not been helpful with getting his CPAP setting changed back to auto 5-20 and he said it has been a few weeks and wanting for it to be done today if possible. Pls regard; [phone]

## 2021-11-17 DIAGNOSIS — N200 Calculus of kidney: Secondary | ICD-10-CM

## 2021-11-17 HISTORY — DX: Calculus of kidney: N20.0

## 2022-01-09 ENCOUNTER — Other Ambulatory Visit: Payer: Self-pay | Admitting: *Deleted

## 2022-01-09 MED ORDER — BEVESPI AEROSPHERE 9-4.8 MCG/ACT IN AERO: INHALATION_SPRAY | RESPIRATORY_TRACT | 3 refills | Status: DC

## 2022-02-04 ENCOUNTER — Ambulatory Visit (INDEPENDENT_AMBULATORY_CARE_PROVIDER_SITE_OTHER): Payer: 59 | Admitting: Nurse Practitioner

## 2022-02-04 ENCOUNTER — Other Ambulatory Visit: Payer: Self-pay

## 2022-02-04 ENCOUNTER — Encounter: Payer: Self-pay | Admitting: Nurse Practitioner

## 2022-02-04 VITALS — BP 101/63 | HR 67 | Temp 98.1°F | Ht 72.0 in | Wt 227.5 lb

## 2022-02-04 DIAGNOSIS — Z1211 Encounter for screening for malignant neoplasm of colon: Secondary | ICD-10-CM

## 2022-02-04 DIAGNOSIS — J0140 Acute pansinusitis, unspecified: Secondary | ICD-10-CM

## 2022-02-04 DIAGNOSIS — F17209 Nicotine dependence, unspecified, with unspecified nicotine-induced disorders: Secondary | ICD-10-CM

## 2022-02-04 DIAGNOSIS — Z6830 Body mass index (BMI) 30.0-30.9, adult: Secondary | ICD-10-CM

## 2022-02-04 DIAGNOSIS — F410 Panic disorder [episodic paroxysmal anxiety] without agoraphobia: Secondary | ICD-10-CM | POA: Diagnosis not present

## 2022-02-04 DIAGNOSIS — Z7689 Persons encountering health services in other specified circumstances: Secondary | ICD-10-CM

## 2022-02-04 MED ORDER — ALPRAZOLAM 0.25 MG PO TABS
0.2500 mg | ORAL_TABLET | Freq: Every day | ORAL | 1 refills | Status: DC | PRN
Start: 2022-02-04 — End: 2022-04-17

## 2022-02-04 NOTE — Progress Notes (Signed)
? ?New Patient Office Visit ? ?Subjective:  ?Patient ID: Billy Thomas, male    DOB: 04/21/1966  Age: 56 y.o. MRN: [id number] ? ?CC:  ?Chief Complaint  ?Patient presents with  ? New Patient (Initial Visit)  ? ? ?HPI ?Billy Thomas presents to establish new primary care provider.  ?-sees [facility]  ?-due to have routine, fasting labs.  ?-due for screening colonoscopy ?-has sinus congestion. Most severe over the weekend. Gradually getting better. States that he is about 70% back to normal.  ?-history of anxiety/panic attacks. Currently has prescription for alprazolam 0.'25mg'$  as needed. Reviewed PDMP profile.  ? ?Past Medical History:  ?Diagnosis Date  ? COPD (chronic obstructive pulmonary disease) ([place])   ? possible; pt states unconfirmed  ? GERD (gastroesophageal reflux disease)   ? History of kidney stones   ? Sleep apnea   ? ? ?Past Surgical History:  ?Procedure Laterality Date  ? APPENDECTOMY    ? BROW LIFT Bilateral 02/01/2021  ? Procedure: BLEPHAROPLASTY;  Surgeon: Cindra Presume, MD;  Location: [facility];  Service: Plastics;  Laterality: Bilateral;  ? CARDIAC CATHETERIZATION  2010  ? RHINOPLASTY    ? SHOULDER SURGERY Bilateral 10 years ago  ? ? ?Family History  ?Problem Relation Age of Onset  ? Cancer Father   ?     lung  ? Heart attack Paternal Grandfather   ? Heart disease Paternal Grandfather   ? ? ?Social History  ? ?Socioeconomic History  ? Marital status: Married  ?  Spouse name: Not on file  ? Number of children: Not on file  ? Years of education: Not on file  ? Highest education level: Not on file  ?Occupational History  ? Not on file  ?Tobacco Use  ? Smoking status: Every Day  ?  Packs/day: 1.00  ?  Years: 32.00  ?  Pack years: 32.00  ?  Types: Cigarettes  ? Smokeless tobacco: Never  ?Vaping Use  ? Vaping Use: Never used  ?Substance and Sexual Activity  ? Alcohol use: No  ? Drug use: No  ? Sexual activity: Yes  ?  Birth control/protection: None  ?Other Topics Concern  ? Not  on file  ?Social History Narrative  ? Not on file  ? ?Social Determinants of Health  ? ?Financial Resource Strain: Not on file  ?Food Insecurity: Not on file  ?Transportation Needs: Not on file  ?Physical Activity: Not on file  ?Stress: Not on file  ?Social Connections: Not on file  ?Intimate Partner Violence: Not on file  ? ? ?ROS ?Review of Systems  ?Constitutional:  Positive for fatigue. Negative for activity change, chills and fever.  ?HENT:  Positive for congestion, postnasal drip, rhinorrhea and sinus pressure. Negative for sinus pain, sneezing and sore throat.   ?Eyes: Negative.   ?Respiratory:  Positive for cough. Negative for shortness of breath and wheezing.   ?Cardiovascular:  Negative for chest pain and palpitations.  ?Gastrointestinal:  Negative for constipation, diarrhea, nausea and vomiting.  ?Endocrine: Negative for cold intolerance, heat intolerance, polydipsia and polyuria.  ?Genitourinary:  Negative for dysuria, frequency and urgency.  ?Musculoskeletal:  Negative for back pain and myalgias.  ?Skin:  Negative for rash.  ?Allergic/Immunologic: Positive for environmental allergies.  ?Neurological:  Negative for dizziness, weakness and headaches.  ?Psychiatric/Behavioral:  The patient is nervous/anxious.   ? ?Objective:  ? ?Today's Vitals  ? 02/04/22 1115  ?BP: 101/63  ?Pulse: 67  ?Temp: 98.1 ?F (36.7 ?C)  ?  SpO2: 97%  ?Weight: 227 lb 8 oz (103.2 kg)  ?Height: 6' (1.829 m)  ? ?Body mass index is 30.85 kg/m?.  ? ?Physical Exam ?Vitals and nursing note reviewed.  ?Constitutional:   ?   Appearance: Normal appearance. He is well-developed.  ?HENT:  ?   Head: Normocephalic and atraumatic.  ?   Right Ear: Tympanic membrane, ear canal and external ear normal.  ?   Left Ear: Tympanic membrane, ear canal and external ear normal.  ?   Nose: Congestion present.  ?   Mouth/Throat:  ?   Mouth: Mucous membranes are moist.  ?   Pharynx: Oropharynx is clear.  ?Eyes:  ?   Extraocular Movements: Extraocular movements  intact.  ?   Conjunctiva/sclera: Conjunctivae normal.  ?   Pupils: Pupils are equal, round, and reactive to light.  ?Cardiovascular:  ?   Rate and Rhythm: Normal rate and regular rhythm.  ?   Pulses: Normal pulses.  ?   Heart sounds: Normal heart sounds.  ?Pulmonary:  ?   Effort: Pulmonary effort is normal.  ?   Breath sounds: Wheezing present.  ?   Comments: Loose, congested cough noted.  Nonproductive. ?Abdominal:  ?   Palpations: Abdomen is soft.  ?Musculoskeletal:     ?   General: Normal range of motion.  ?   Cervical back: Normal range of motion and neck supple.  ?Lymphadenopathy:  ?   Cervical: No cervical adenopathy.  ?Skin: ?   General: Skin is warm and dry.  ?   Capillary Refill: Capillary refill takes less than 2 seconds.  ?Neurological:  ?   General: No focal deficit present.  ?   Mental Status: He is alert and oriented to person, place, and time.  ?Psychiatric:     ?   Mood and Affect: Mood normal.     ?   Behavior: Behavior normal.     ?   Thought Content: Thought content normal.     ?   Judgment: Judgment normal.  ? ? ?Assessment & Plan:  ?1. Acute non-recurrent pansinusitis ?Start Z-Pak.  Take as directed. Rest and increase fluids. Continue using OTC medication to control symptoms.   ?- azithromycin (ZITHROMAX) 250 MG tablet; z-pack - take as directed for 5 days  Dispense: 6 tablet; Refill: 0 ? ?2. Body mass index (BMI) of 30.0-30.9 in adult ?Encourage patient to limit calorie intake to 2000 cal/day or less.  He should consume a low cholesterol, low-fat diet.  Recommend he incorporate exercise into his daily routine.  ? ?3. Panic attack ?Patient may take alprazolam 0.25 mg tablets daily if needed.  A new prescription was sent to his pharmacy today. ?- ALPRAZolam (XANAX) 0.25 MG tablet; Take 1 tablet (0.25 mg total) by mouth daily as needed.  Dispense: 30 tablet; Refill: 1 ? ?4. Tobacco use disorder, continuous ?Patient is a current everyday smoker.  He is not ready to quit at this time. ? ?5.  Screening for colon cancer ?Refer to GI for screening colonoscopy. ?- Ambulatory referral to Gastroenterology ? ?6. Encounter to establish care ?Appointment today to establish new primary care provider   ? ?  ?Problem List Items Addressed This Visit   ? ?  ? Respiratory  ? Acute non-recurrent pansinusitis - Primary  ? Relevant Medications  ? azithromycin (ZITHROMAX) 250 MG tablet  ?  ? Other  ? Panic attack (Chronic)  ? Relevant Medications  ? ALPRAZolam (XANAX) 0.25 MG tablet  ? Tobacco use disorder,  continuous  ? Body mass index (BMI) of 30.0-30.9 in adult  ? ?Other Visit Diagnoses   ? ? Screening for colon cancer      ? Relevant Orders  ? Ambulatory referral to Gastroenterology  ? Encounter to establish care      ? ?  ? ? ?Outpatient Encounter Medications as of 02/04/2022  ?Medication Sig  ? azithromycin (ZITHROMAX) 250 MG tablet z-pack - take as directed for 5 days  ? Glycopyrrolate-Formoterol (BEVESPI AEROSPHERE) 9-4.8 MCG/ACT AERO INHALE 2 PUFFS INTO THE LUNGS 2 TIMES A DAY  ? [DISCONTINUED] ALPRAZolam (XANAX) 0.25 MG tablet Take 1 tablet (0.25 mg total) by mouth daily as needed.  ? albuterol (VENTOLIN HFA) 108 (90 Base) MCG/ACT inhaler Inhale 2 puffs every 6 hours if needd  ? ALPRAZolam (XANAX) 0.25 MG tablet Take 1 tablet (0.25 mg total) by mouth daily as needed.  ? [DISCONTINUED] omeprazole (PRILOSEC) 10 MG capsule Take 10 mg by mouth daily.  ? [DISCONTINUED] tamsulosin (FLOMAX) 0.4 MG CAPS capsule Take 1 capsule (0.4 mg total) by mouth daily.  ? ?No facility-administered encounter medications on file as of 02/04/2022.  ? ? ?Follow-up: Return in about 2 months (around 04/06/2022) for health maintenance exam, FBW a week prior to visit with PSA.  ? ?Ronnell Freshwater, NP ? ?

## 2022-02-05 MED ORDER — AZITHROMYCIN 250 MG PO TABS: ORAL_TABLET | ORAL | 0 refills | Status: DC

## 2022-02-07 IMAGING — DX DG CHEST 2V
2 series · 2 of 2 positions shown · non-contrast
Comparison: August 26, 2018.

CLINICAL DATA: Chest pain.

EXAM:
CHEST - 2 VIEW

[w chest pa]
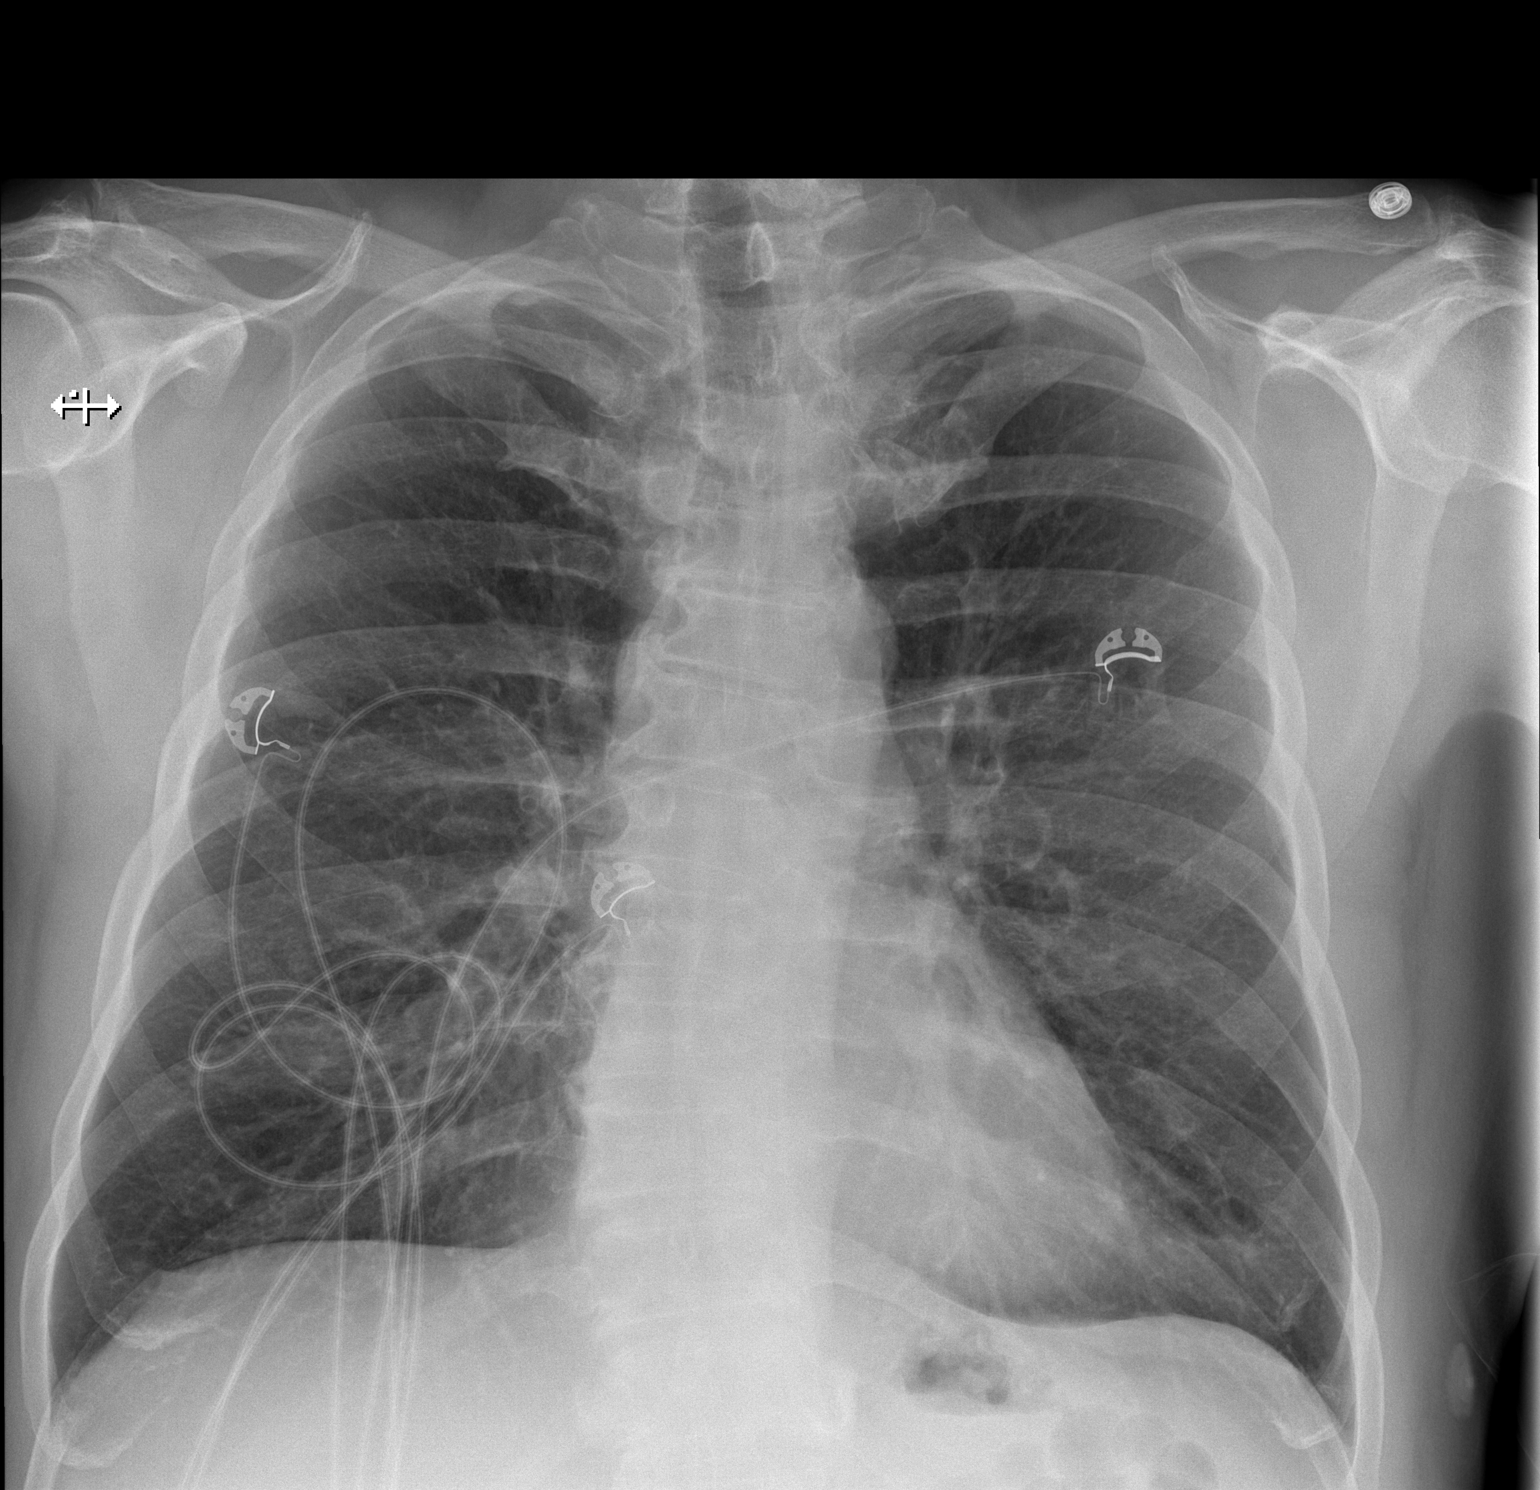

[w chest lat]
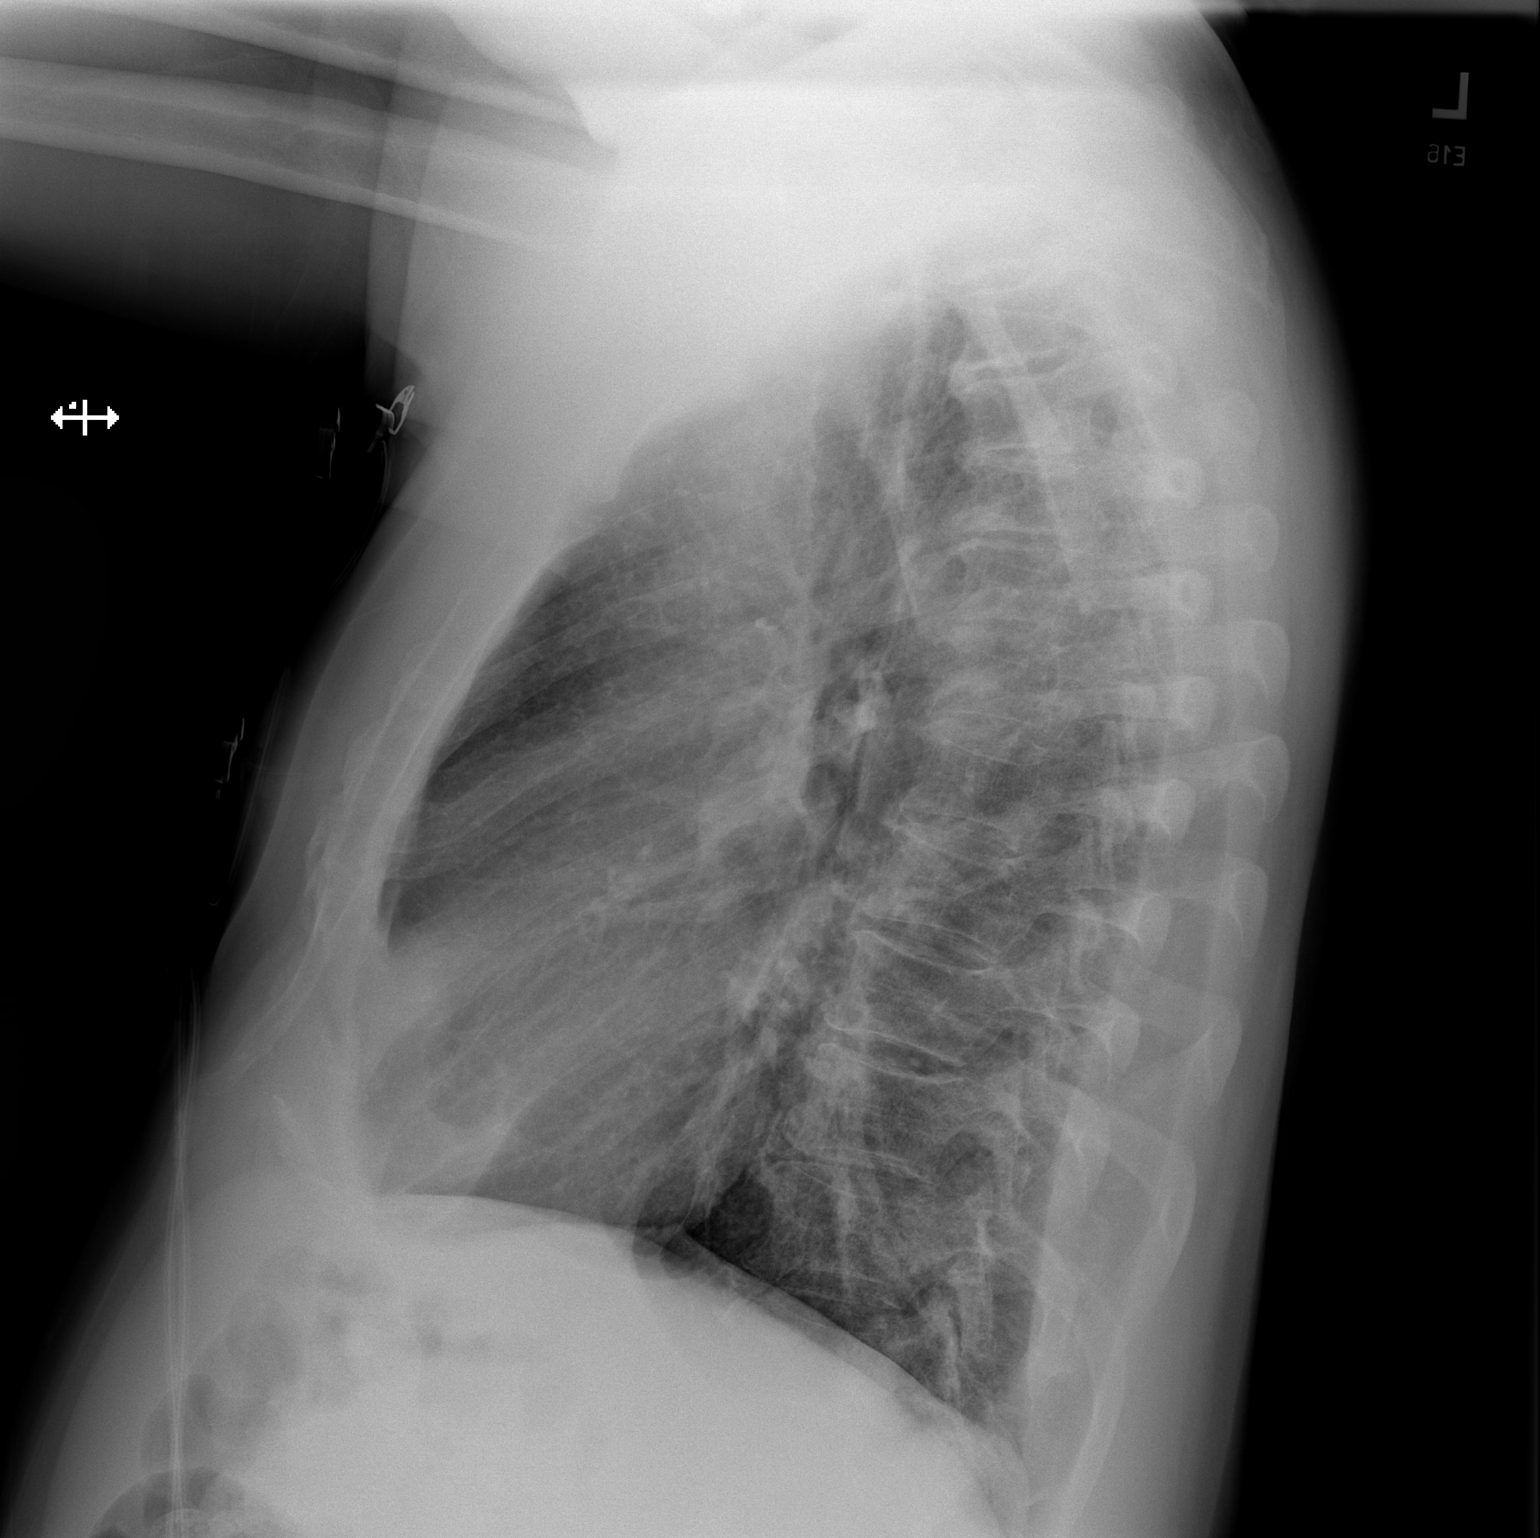

[2 of 2 positions shown; findings below may reference images not displayed]

FINDINGS: The heart size and mediastinal contours are within normal limits.
Both lungs are clear. Similar hyperinflation. No visible pleural
effusions or pneumothorax. No acute osseous abnormality.
IMPRESSION: 1. No active cardiopulmonary disease.
2. Similar hyperinflation.

## 2022-02-16 DIAGNOSIS — J0140 Acute pansinusitis, unspecified: Secondary | ICD-10-CM | POA: Insufficient documentation

## 2022-02-16 DIAGNOSIS — Z6830 Body mass index (BMI) 30.0-30.9, adult: Secondary | ICD-10-CM | POA: Insufficient documentation

## 2022-02-16 DIAGNOSIS — F17209 Nicotine dependence, unspecified, with unspecified nicotine-induced disorders: Secondary | ICD-10-CM | POA: Insufficient documentation

## 2022-03-31 ENCOUNTER — Other Ambulatory Visit: Payer: Self-pay

## 2022-03-31 DIAGNOSIS — Z125 Encounter for screening for malignant neoplasm of prostate: Secondary | ICD-10-CM

## 2022-03-31 DIAGNOSIS — Z130 Encounter for screening for diseases of the blood and blood-forming organs and certain disorders involving the immune mechanism: Secondary | ICD-10-CM

## 2022-03-31 DIAGNOSIS — Z0000 Encounter for general adult medical examination without abnormal findings: Secondary | ICD-10-CM

## 2022-04-02 ENCOUNTER — Other Ambulatory Visit: Payer: 59

## 2022-04-02 DIAGNOSIS — Z1329 Encounter for screening for other suspected endocrine disorder: Secondary | ICD-10-CM

## 2022-04-02 DIAGNOSIS — Z125 Encounter for screening for malignant neoplasm of prostate: Secondary | ICD-10-CM

## 2022-04-02 DIAGNOSIS — Z0000 Encounter for general adult medical examination without abnormal findings: Secondary | ICD-10-CM

## 2022-04-03 LAB — CBC WITH DIFFERENTIAL/PLATELET
Basophils Absolute: 0 10*3/uL (ref 0.0–0.2)
Basos: 1 %
EOS (ABSOLUTE): 0.1 10*3/uL (ref 0.0–0.4)
Eos: 3 %
Hematocrit: 41.6 % (ref 37.5–51.0)
Hemoglobin: 14.6 g/dL (ref 13.0–17.7)
Immature Grans (Abs): 0 10*3/uL (ref 0.0–0.1)
Immature Granulocytes: 0 %
Lymphocytes Absolute: 1.7 10*3/uL (ref 0.7–3.1)
Lymphs: 34 %
MCH: 33.4 pg — ABNORMAL HIGH (ref 26.6–33.0)
MCHC: 35.1 g/dL (ref 31.5–35.7)
MCV: 95 fL (ref 79–97)
Monocytes Absolute: 0.4 10*3/uL (ref 0.1–0.9)
Monocytes: 9 %
Neutrophils Absolute: 2.7 10*3/uL (ref 1.4–7.0)
Neutrophils: 53 %
Platelets: 222 10*3/uL (ref 150–450)
RBC: 4.37 x10E6/uL (ref 4.14–5.80)
RDW: 13.1 % (ref 11.6–15.4)
WBC: 5.1 10*3/uL (ref 3.4–10.8)

## 2022-04-03 LAB — COMPREHENSIVE METABOLIC PANEL
ALT: 18 IU/L (ref 0–44)
AST: 19 IU/L (ref 0–40)
Albumin/Globulin Ratio: 2.1 (ref 1.2–2.2)
Albumin: 4.8 g/dL (ref 3.8–4.9)
Alkaline Phosphatase: 84 IU/L (ref 44–121)
BUN/Creatinine Ratio: 24 — ABNORMAL HIGH (ref 9–20)
BUN: 21 mg/dL (ref 6–24)
Bilirubin Total: 0.4 mg/dL (ref 0.0–1.2)
CO2: 23 mmol/L (ref 20–29)
Calcium: 9 mg/dL (ref 8.7–10.2)
Chloride: 101 mmol/L (ref 96–106)
Creatinine, Ser: 0.88 mg/dL (ref 0.76–1.27)
Globulin, Total: 2.3 g/dL (ref 1.5–4.5)
Glucose: 113 mg/dL — ABNORMAL HIGH (ref 70–99)
Potassium: 4.5 mmol/L (ref 3.5–5.2)
Sodium: 139 mmol/L (ref 134–144)
Total Protein: 7.1 g/dL (ref 6.0–8.5)
eGFR: 102 mL/min/{1.73_m2} (ref >59)

## 2022-04-03 LAB — LIPID PANEL
Chol/HDL Ratio: 3.5 ratio (ref 0.0–5.0)
Cholesterol, Total: 177 mg/dL (ref 100–199)
HDL: 51 mg/dL (ref >39)
LDL Chol Calc (NIH): 109 mg/dL — ABNORMAL HIGH (ref 0–99)
Triglycerides: 92 mg/dL (ref 0–149)
VLDL Cholesterol Cal: 17 mg/dL (ref 5–40)

## 2022-04-03 LAB — HEMOGLOBIN A1C
Est. average glucose Bld gHb Est-mCnc: 126 mg/dL
Hgb A1c MFr Bld: 6 % — ABNORMAL HIGH (ref 4.8–5.6)

## 2022-04-03 LAB — PSA: Prostate Specific Ag, Serum: 0.8 ng/mL (ref 0.0–4.0)

## 2022-04-03 LAB — TSH: TSH: 1.32 u[IU]/mL (ref 0.450–4.500)

## 2022-04-09 ENCOUNTER — Encounter: Payer: Self-pay | Admitting: Nurse Practitioner

## 2022-04-09 ENCOUNTER — Ambulatory Visit (INDEPENDENT_AMBULATORY_CARE_PROVIDER_SITE_OTHER): Payer: 59 | Admitting: Nurse Practitioner

## 2022-04-09 VITALS — BP 101/60 | HR 61 | Temp 98.1°F | Ht 72.05 in | Wt 221.1 lb

## 2022-04-09 DIAGNOSIS — Z0001 Encounter for general adult medical examination with abnormal findings: Secondary | ICD-10-CM

## 2022-04-09 DIAGNOSIS — R7301 Impaired fasting glucose: Secondary | ICD-10-CM | POA: Diagnosis not present

## 2022-04-09 DIAGNOSIS — Z1211 Encounter for screening for malignant neoplasm of colon: Secondary | ICD-10-CM

## 2022-04-09 DIAGNOSIS — Z0000 Encounter for general adult medical examination without abnormal findings: Secondary | ICD-10-CM

## 2022-04-09 DIAGNOSIS — E785 Hyperlipidemia, unspecified: Secondary | ICD-10-CM

## 2022-04-09 DIAGNOSIS — Z125 Encounter for screening for malignant neoplasm of prostate: Secondary | ICD-10-CM

## 2022-04-09 NOTE — Progress Notes (Signed)
Complete physical exam   Patient: Billy Thomas   DOB: May 06, 1966   56 y.o. Male  MRN: [id number] Visit Date: 04/09/2022    Chief Complaint  Patient presents with   Annual Exam   Subjective    Billy Thomas is a 56 y.o. male who presents today for a complete physical exam.  He reports consuming a general diet. The patient has a physically strenuous job, but has no regular exercise apart from work.  He generally feels well. He does not have additional problems to discuss today.   HPI  The patiet presents for annual physical -labs done prior to this visit.  --mild elevation of LDL --mild elevation of glucose at 113 with HgbA1c at 6.0.  -patient has no physical concerns or complaints today.  -due for colon cancer screen.  -patient is a long term smoker and not ready to quit at this time.   Past Medical History:  Diagnosis Date   COPD (chronic obstructive pulmonary disease) ([place])    possible; pt states unconfirmed   GERD (gastroesophageal reflux disease)    History of kidney stones    Sleep apnea    Past Surgical History:  Procedure Laterality Date   APPENDECTOMY     BROW LIFT Bilateral 02/01/2021   Procedure: BLEPHAROPLASTY;  Surgeon: Cindra Presume, MD;  Location: [facility];  Service: Plastics;  Laterality: Bilateral;   CARDIAC CATHETERIZATION  2010   RHINOPLASTY     SHOULDER SURGERY Bilateral 10 years ago   Social History   Socioeconomic History   Marital status: Married    Spouse name: Not on file   Number of children: Not on file   Years of education: Not on file   Highest education level: Not on file  Occupational History   Not on file  Tobacco Use   Smoking status: Every Day    Packs/day: 1.00    Years: 32.00    Pack years: 32.00    Types: Cigarettes   Smokeless tobacco: Never  Vaping Use   Vaping Use: Never used  Substance and Sexual Activity   Alcohol use: No   Drug use: No   Sexual activity: Yes    Birth control/protection:  None  Other Topics Concern   Not on file  Social History Narrative   Not on file   Social Determinants of Health   Financial Resource Strain: Not on file  Food Insecurity: Not on file  Transportation Needs: Not on file  Physical Activity: Not on file  Stress: Not on file  Social Connections: Not on file  Intimate Partner Violence: Not on file   Family Status  Relation Name Status   Father  Deceased   Mother  Alive   Sister  Hosmer   Daughter  Laurens  Deceased       suicide   MGM  Deceased   MGF  Deceased   PGM  Deceased   PGF  Deceased   Daughter  Alive   Annamarie Major  Deceased       suicide   Family History  Problem Relation Age of Onset   Cancer Father        lung   Heart attack Paternal Grandfather    Heart disease Paternal Grandfather    Allergies  Allergen Reactions   Hydrocodone Nausea Only    Patient states he can take this medication but will need nausea medication  Patient Care Team: Ronnell Freshwater, NP as PCP - General (Family Medicine)   Medications: Outpatient Medications Prior to Visit  Medication Sig   ALPRAZolam (XANAX) 0.25 MG tablet Take 1 tablet (0.25 mg total) by mouth daily as needed.   azithromycin (ZITHROMAX) 250 MG tablet z-pack - take as directed for 5 days   Glycopyrrolate-Formoterol (BEVESPI AEROSPHERE) 9-4.8 MCG/ACT AERO INHALE 2 PUFFS INTO THE LUNGS 2 TIMES A DAY   No facility-administered medications prior to visit.    Review of Systems  Constitutional:  Negative for activity change, chills, fatigue and fever.  HENT:  Negative for congestion, postnasal drip, rhinorrhea, sinus pressure, sinus pain, sneezing and sore throat.   Eyes: Negative.   Respiratory:  Negative for cough, shortness of breath and wheezing.   Cardiovascular:  Negative for chest pain and palpitations.  Gastrointestinal:  Negative for constipation, diarrhea, nausea and vomiting.  Endocrine: Negative for cold intolerance, heat  intolerance, polydipsia and polyuria.  Genitourinary:  Negative for dysuria, frequency and urgency.  Musculoskeletal:  Negative for back pain and myalgias.  Skin:  Negative for rash.  Allergic/Immunologic: Positive for environmental allergies.  Neurological:  Negative for dizziness, weakness and headaches.  Psychiatric/Behavioral:  The patient is not nervous/anxious.    Last CBC Lab Results  Component Value Date   WBC 5.1 04/02/2022   HGB 14.6 04/02/2022   HCT 41.6 04/02/2022   MCV 95 04/02/2022   MCH 33.4 (H) 04/02/2022   RDW 13.1 04/02/2022   PLT 222 18/29/9371   Last metabolic panel Lab Results  Component Value Date   GLUCOSE 113 (H) 04/02/2022   NA 139 04/02/2022   K 4.5 04/02/2022   CL 101 04/02/2022   CO2 23 04/02/2022   BUN 21 04/02/2022   CREATININE 0.88 04/02/2022   EGFR 102 04/02/2022   CALCIUM 9.0 04/02/2022   PROT 7.1 04/02/2022   ALBUMIN 4.8 04/02/2022   LABGLOB 2.3 04/02/2022   AGRATIO 2.1 04/02/2022   BILITOT 0.4 04/02/2022   ALKPHOS 84 04/02/2022   AST 19 04/02/2022   ALT 18 04/02/2022   ANIONGAP 8 01/29/2021   Last lipids Lab Results  Component Value Date   CHOL 177 04/02/2022   HDL 51 04/02/2022   LDLCALC 109 (H) 04/02/2022   TRIG 92 04/02/2022   CHOLHDL 3.5 04/02/2022   Last hemoglobin A1c Lab Results  Component Value Date   HGBA1C 6.0 (H) 04/02/2022   Last thyroid functions Lab Results  Component Value Date   TSH 1.320 04/02/2022       Objective     Today's Vitals   04/09/22 0856 04/09/22 0930  BP: (!) 95/52 101/60  Pulse: 61   Temp: 98.1 F (36.7 C)   SpO2: 95%   Weight: 221 lb 1.9 oz (100.3 kg)   Height: 6' 0.05" (1.83 m)    Body mass index is 29.95 kg/m.   BP Readings from Last 3 Encounters:  04/09/22 101/60  02/04/22 101/63  04/23/21 112/60    Wt Readings from Last 3 Encounters:  04/09/22 221 lb 1.9 oz (100.3 kg)  02/04/22 227 lb 8 oz (103.2 kg)  04/23/21 224 lb 6.4 oz (101.8 kg)     Physical Exam Vitals  and nursing note reviewed.  Constitutional:      Appearance: Normal appearance. He is well-developed.  HENT:     Head: Normocephalic and atraumatic.     Right Ear: Tympanic membrane, ear canal and external ear normal.     Left Ear: Tympanic membrane,  ear canal and external ear normal.     Nose: Nose normal.     Mouth/Throat:     Mouth: Mucous membranes are moist.     Pharynx: Oropharynx is clear.  Eyes:     Extraocular Movements: Extraocular movements intact.     Conjunctiva/sclera: Conjunctivae normal.     Pupils: Pupils are equal, round, and reactive to light.  Neck:     Vascular: No carotid bruit.  Cardiovascular:     Rate and Rhythm: Normal rate and regular rhythm.     Pulses: Normal pulses.     Heart sounds: Normal heart sounds.  Pulmonary:     Effort: Pulmonary effort is normal.     Breath sounds: Wheezing present.     Comments: Expiratory wheezes present which clear with cough.  Abdominal:     General: Bowel sounds are normal. There is no distension.     Palpations: Abdomen is soft. There is no mass.     Tenderness: There is no abdominal tenderness. There is no right CVA tenderness, left CVA tenderness or rebound.     Hernia: No hernia is present.  Musculoskeletal:        General: Normal range of motion.     Cervical back: Normal range of motion and neck supple.  Lymphadenopathy:     Cervical: No cervical adenopathy.  Skin:    General: Skin is warm and dry.     Capillary Refill: Capillary refill takes less than 2 seconds.  Neurological:     General: No focal deficit present.     Mental Status: He is alert and oriented to person, place, and time.  Psychiatric:        Mood and Affect: Mood normal.        Behavior: Behavior normal.        Thought Content: Thought content normal.        Judgment: Judgment normal.     Last depression screening scores    04/09/2022    8:58 AM 02/04/2022   11:19 AM 12/11/2020    3:06 PM  PHQ 2/9 Scores  PHQ - 2 Score 0 0 0  PHQ- 9  Score 1 2    Last fall risk screening    02/04/2022   11:19 AM  [place] in the past year? 0  Number falls in past yr: 0  Injury with Fall? 0  Follow up Falls evaluation completed      Assessment & Plan    1. Encounter for general adult medical examination with abnormal findings Annual physical today   2. Dyslipidemia, goal LDL below 100 Reviewed labs with LDL 109. Recommend he limit intake of fried and fatty foods. He should increase intake of lean proteins and green leafy vegetables. Adding exercise into daily routine will also be beneficial.  Recheck lipids in one year  - Lipid panel; Future  3. Impaired fasting glucose Elevated glucose with HgbA1c 6.0. recommend he limit intake of sugar and carbohydrates and increase water intake. Will recheck in one year.   4. Screening for colon cancer Refer to GI for screening colonoscopy  - Ambulatory referral to Gastroenterology  5. Screening for prostate cancer PSA normal. Recheck in one year  - PSA; Future  6. Healthcare maintenance Recheck routine, fasting labs in one year.  - CBC with Differential/Platelet; Future - Comprehensive metabolic panel; Future - Lipid panel; Future - TSH; Future      Immunization History  Administered Date(s) Administered  Influenza,inj,Quad PF,6+ Mos 07/23/2017, 08/26/2018, 12/11/2020   Moderna Sars-Covid-2 Vaccination 06/12/2020, 07/10/2020, 12/20/2020   Pneumococcal Polysaccharide-23 08/26/2018   Tdap 06/14/2007, 07/21/2017    Health Maintenance  Topic Date Due   HIV Screening  Never done   Hepatitis C Screening  Never done   COVID-19 Vaccine (4 - Booster for Moderna series) 02/14/2021   COLONOSCOPY (Pts 45-68yr Insurance coverage will need to be confirmed)  05/16/2022 (Originally 08/20/2012)   Zoster Vaccines- Shingrix (1 of 2) 07/10/2022 (Originally 08/20/2017)   INFLUENZA VACCINE  06/17/2022   TETANUS/TDAP  07/22/2027   HPV VACCINES  Aged Out    Discussed health  benefits of physical activity, and encouraged him to engage in regular exercise appropriate for his age and condition.  Problem List Items Addressed This Visit       Endocrine   Impaired fasting glucose     Other   Healthcare maintenance   Relevant Orders   CBC with Differential/Platelet   Comprehensive metabolic panel   Lipid panel   TSH   Dyslipidemia, goal LDL below 100   Relevant Orders   Lipid panel   Other Visit Diagnoses     Encounter for general adult medical examination with abnormal findings    -  Primary   Screening for colon cancer       Relevant Orders   Ambulatory referral to Gastroenterology   Screening for prostate cancer       Relevant Orders   PSA        Return in about 1 year (around 04/10/2023) for health maintenance exam, FBW a week prior to visit with PSA for prostate cancer screen. .Marland Kitchen       HRonnell Freshwater NP  [phone](phone) [phone](fax)  [place]

## 2022-04-09 NOTE — Progress Notes (Signed)
Reviewed labs with patient during visit.

## 2022-04-17 ENCOUNTER — Other Ambulatory Visit: Payer: Self-pay | Admitting: Nurse Practitioner

## 2022-04-17 ENCOUNTER — Encounter: Payer: Self-pay | Admitting: Nurse Practitioner

## 2022-04-17 DIAGNOSIS — F410 Panic disorder [episodic paroxysmal anxiety] without agoraphobia: Secondary | ICD-10-CM

## 2022-04-17 NOTE — Telephone Encounter (Signed)
I had only filled this for him once at his new patient appointment in 01/2022. I did forget the controlled substances policy. When I saw him for physical, I planned to get one, but he did not need/want a new prescription. I will have to get one for him at his next visit, but he will have to be seen, as when I wrote it initially, I thought this was something he rarely took.

## 2022-04-17 NOTE — Telephone Encounter (Signed)
Fill for #30 days only. Will need to see patient prior to further refills.

## 2022-04-23 ENCOUNTER — Ambulatory Visit: Payer: 59 | Admitting: Internal Medicine

## 2022-04-29 ENCOUNTER — Ambulatory Visit (INDEPENDENT_AMBULATORY_CARE_PROVIDER_SITE_OTHER): Payer: 59 | Admitting: Nurse Practitioner

## 2022-04-29 ENCOUNTER — Encounter: Payer: Self-pay | Admitting: Nurse Practitioner

## 2022-04-29 VITALS — BP 110/66 | HR 74 | Temp 97.7°F | Ht 72.0 in | Wt 216.0 lb

## 2022-04-29 DIAGNOSIS — N390 Urinary tract infection, site not specified: Secondary | ICD-10-CM | POA: Diagnosis not present

## 2022-04-29 DIAGNOSIS — R319 Hematuria, unspecified: Secondary | ICD-10-CM

## 2022-04-29 DIAGNOSIS — Z87442 Personal history of urinary calculi: Secondary | ICD-10-CM | POA: Diagnosis not present

## 2022-04-29 LAB — POCT URINALYSIS DIPSTICK
Bilirubin, UA: NEGATIVE
Glucose, UA: NEGATIVE
Leukocytes, UA: NEGATIVE
Nitrite, UA: NEGATIVE
Protein, UA: NEGATIVE
Spec Grav, UA: >=1.03 — AB (ref 1.010–1.025)
Urobilinogen, UA: 0.2 E.U./dL
pH, UA: 5.5 (ref 5.0–8.0)

## 2022-04-29 MED ORDER — SULFAMETHOXAZOLE-TRIMETHOPRIM 800-160 MG PO TABS: 1.0000 | ORAL_TABLET | Freq: Two times a day (BID) | ORAL | 0 refills | Status: DC

## 2022-04-29 MED ORDER — TAMSULOSIN HCL 0.4 MG PO CAPS: 0.4000 mg | ORAL_CAPSULE | Freq: Every day | ORAL | 3 refills | Status: DC

## 2022-04-29 NOTE — Progress Notes (Signed)
Established patient visit   Patient: Billy Thomas   DOB: October 11, 1966   56 y.o. Male  MRN: [id number] Visit Date: 04/29/2022  Chief Complaint  Patient presents with   Hematuria        Subjective    HPI HPI     Hematuria    Additional comments:        Last edited by Mickel Crow, CMA on 04/29/2022  4:17 PM.      Patient has had blood in his urine along with bladder pain and dysuria. Started about two weeks ago.  -does have history of kidney stones -has not noted passing of any renal stones.  -sometimes, has decreased urinary stream.  -gets worse as the day goes on.  -states hat he does ride horses most days. States that sitting on a horse and riding causes increased pain. The longer he is on the horse, the more severe the pain.  -he denies nausea or vomiting at this time.   Medications: Outpatient Medications Prior to Visit  Medication Sig   ALPRAZolam (XANAX) 0.25 MG tablet TAKE 1 TABLET (0.25 MG TOTAL) BY MOUTH DAILY AS NEEDED.   azithromycin (ZITHROMAX) 250 MG tablet z-pack - take as directed for 5 days   Glycopyrrolate-Formoterol (BEVESPI AEROSPHERE) 9-4.8 MCG/ACT AERO INHALE 2 PUFFS INTO THE LUNGS 2 TIMES A DAY   No facility-administered medications prior to visit.    Review of Systems  Constitutional:  Positive for fatigue. Negative for activity change, chills and fever.  HENT:  Negative for congestion, postnasal drip, rhinorrhea, sinus pressure, sinus pain, sneezing and sore throat.   Eyes: Negative.   Respiratory:  Negative for cough, shortness of breath and wheezing.   Cardiovascular:  Negative for chest pain and palpitations.  Gastrointestinal:  Negative for constipation, diarrhea, nausea and vomiting.  Endocrine: Negative for cold intolerance, heat intolerance, polydipsia and polyuria.  Genitourinary:  Positive for decreased urine volume, dysuria, flank pain, hematuria and testicular pain. Negative for frequency and urgency.  Musculoskeletal:   Negative for back pain and myalgias.  Skin:  Negative for rash.  Allergic/Immunologic: Negative for environmental allergies.  Neurological:  Negative for dizziness, weakness and headaches.  Psychiatric/Behavioral:  The patient is not nervous/anxious.      Objective     Today's Vitals   04/29/22 1617  BP: 110/66  Pulse: 74  Temp: 97.7 F (36.5 C)  SpO2: 100%  Weight: 216 lb (98 kg)  Height: 6' (1.829 m)   Body mass index is 29.29 kg/m.   Physical Exam Vitals and nursing note reviewed.  Constitutional:      Appearance: Normal appearance. He is well-developed.  HENT:     Head: Normocephalic and atraumatic.     Nose: Nose normal.     Mouth/Throat:     Mouth: Mucous membranes are moist.     Pharynx: Oropharynx is clear.  Eyes:     Extraocular Movements: Extraocular movements intact.     Conjunctiva/sclera: Conjunctivae normal.     Pupils: Pupils are equal, round, and reactive to light.  Cardiovascular:     Rate and Rhythm: Normal rate and regular rhythm.     Pulses: Normal pulses.  Pulmonary:     Effort: Pulmonary effort is normal.     Breath sounds: Normal breath sounds.  Abdominal:     Palpations: Abdomen is soft.     Tenderness: There is right CVA tenderness and left CVA tenderness.  Genitourinary:    Comments: Urine sample positive for moderate blood  and trace ketones.  Musculoskeletal:        General: Normal range of motion.     Cervical back: Normal range of motion and neck supple.  Lymphadenopathy:     Cervical: No cervical adenopathy.  Skin:    General: Skin is warm and dry.     Capillary Refill: Capillary refill takes less than 2 seconds.  Neurological:     General: No focal deficit present.     Mental Status: He is alert and oriented to person, place, and time.  Psychiatric:        Mood and Affect: Mood normal.        Behavior: Behavior normal.        Thought Content: Thought content normal.        Judgment: Judgment normal.      Results for  orders placed or performed in visit on 04/29/22  Urine Culture   Specimen: Urine   Urine  Result Value Ref Range   Urine Culture, Routine Final report    Organism ID, Bacteria Comment   POCT urinalysis dipstick  Result Value Ref Range   Color, UA yellow    Clarity, UA clear    Glucose, UA Negative Negative   Bilirubin, UA negative    Ketones, UA trace    Spec Grav, UA >=1.030 (A) 1.010 - 1.025   Blood, UA moderate    pH, UA 5.5 5.0 - 8.0   Protein, UA Negative Negative   Urobilinogen, UA 0.2 0.2 or 1.0 E.U./dL   Nitrite, UA negative    Leukocytes, UA Negative Negative   Appearance     Odor      Assessment & Plan     1. Urinary tract infection with hematuria, site unspecified Start bactrim DS twice daily for next 7 days. Send urine for culture and sensitivity and adjust treatment as indicated. Recommend CT renal stone study and referral to urology due to known history of renal calculi. - Ambulatory referral to Urology - Urine Culture - POCT urinalysis dipstick - sulfamethoxazole-trimethoprim (BACTRIM DS) 800-160 MG tablet; Take 1 tablet by mouth 2 (two) times daily.  Dispense: 14 tablet; Refill: 0 - CT RENAL STONE STUDY; Future  2. History of kidney stones Add flomax 0.4 mg daily to help aid passage of stone if present. Recommend CT renal stone study and referral to urology due to known history of renal calculi.  - Ambulatory referral to Urology - Urine Culture - POCT urinalysis dipstick - tamsulosin (FLOMAX) 0.4 MG CAPS capsule; Take 1 capsule (0.4 mg total) by mouth daily.  Dispense: 30 capsule; Refill: 3 - CT RENAL STONE STUDY; Future   Return for prn worsening or persistent symptoms.        Ronnell Freshwater, NP  [facility] at [facility] [phone] (phone) [phone] (fax)  [place]

## 2022-04-29 NOTE — Patient Instructions (Signed)

## 2022-05-02 DIAGNOSIS — N390 Urinary tract infection, site not specified: Secondary | ICD-10-CM | POA: Insufficient documentation

## 2022-05-02 LAB — URINE CULTURE

## 2022-05-04 NOTE — Progress Notes (Unsigned)
HPI male smoker followed for OSA, COPD, BPH, anxiety, GERD, obesity, Office Spirometry 07/23/17-moderate obstructive airways disease. FVC 3.42/63%, FEV1 2.71/64%, ratio 0.79, FEF 25-75% 2.62/71% HST 08/03/17-AHI 12.2/hour, desaturation to 86%, body weight 232 pounds -------------------------------------------------------------------------------   04/23/21- 56 year old male Smoker followed for OSA, COPD, BPH, Anxiety, GERD, obesity,ETOH, BPH,  - Bevespi, Ventolin hfa,  CPAP auto 5-20/[employer] Download- compliance 100%, AHI 1.4/ hr Body weight today-224 lbs Covid vax- -----Patient feels good overall, no concerns at this time Would like CPAP pressure to start a little higher. Download reviewed. Breathing feels stable- he is physically active. Has been out of inhalers over a week pending this appointment. Continues to smoke.  CXR 01/29/21- IMPRESSION: 1. No active cardiopulmonary disease. 2. Similar hyperinflation.  05/06/22-  56 year old male Smoker followed for OSA, COPD,complicated by  BPH, Anxiety, GERD, obesity, - Bevespi, Ventolin hfa,  CPAP auto 5-20/[employer] Download- compliance 100%, AHI 1.1/ hr Body weight today-216 lbs Covid vax-3 Moderna -----Follow up. Patient has no complaints.  He is very comfortable with his CPAP and does not sleep without it.  No concerns. Considers his breathing stable.  Still smokes which we discussed.  Using his Bevespi 1 puff twice daily, usually.  Rarely uses rescue inhaler at all.  ROS-see HPI   + = positive Constitutional:    weight loss, night sweats, fevers, chills, fatigue, lassitude. HEENT:    headaches, difficulty swallowing, tooth/dental problems, sore throat,       sneezing, itching, ear ache, nasal congestion, post nasal drip, snoring CV:    chest pain, orthopnea, PND, swelling in lower extremities, anasarca,                                              dizziness, palpitations Resp:   + shortness of breath with exertion or at rest.                 productive cough,   non-productive cough, coughing up of blood.              change in color of mucus.  wheezing.   Skin:    rash or lesions. GI:  No-   heartburn, indigestion, abdominal pain, nausea, vomiting, diarrhea,                 change in bowel habits, loss of appetite GU: dysuria, change in color of urine, no urgency or frequency.   flank pain. MS:   joint pain, stiffness, decreased range of motion, back pain. Neuro-     nothing unusual Psych:  change in mood or affect.  depression or +anxiety.   memory loss.  OBJ- Physical Exam General- Alert, Oriented, Affect-appropriate, Distress- none acute Skin- rash-none, lesions- none, excoriation- none Lymphadenopathy- none Head- atraumatic            Eyes- Gross vision intact, PERRLA, conjunctivae and secretions clear            Ears- Hearing, canals-normal            Nose- Clear, no-Septal dev, mucus, polyps, erosion, perforation             Throat- Mallampati IV , mucosa clear , drainage- none, tonsils- atrophic, Neck- flexible , trachea midline, no stridor , thyroid nl, carotid no bruit Chest - symmetrical excursion , unlabored           Heart/CV- RRR , no  murmur , no gallop  , no rub, nl s1 s2                           - JVD- none , edema- none, stasis changes- none, varices- none           Lung- , wheeze+/unlabored, cough+with deep breath, dullness-none, rub- none           Chest wall-  Abd-  Br/ Gen/ Rectal- Not done, not indicated Extrem- cyanosis- none, clubbing, none, atrophy- none, strength- nl Neuro- grossly intact to observation

## 2022-05-06 ENCOUNTER — Ambulatory Visit (INDEPENDENT_AMBULATORY_CARE_PROVIDER_SITE_OTHER): Payer: 59 | Admitting: Internal Medicine

## 2022-05-06 ENCOUNTER — Encounter: Payer: Self-pay | Admitting: Internal Medicine

## 2022-05-06 ENCOUNTER — Ambulatory Visit (INDEPENDENT_AMBULATORY_CARE_PROVIDER_SITE_OTHER): Payer: 59

## 2022-05-06 VITALS — BP 92/50 | HR 72 | Temp 98.2°F | Ht 72.0 in | Wt 216.4 lb

## 2022-05-06 DIAGNOSIS — Z720 Tobacco use: Secondary | ICD-10-CM

## 2022-05-06 DIAGNOSIS — J449 Chronic obstructive pulmonary disease, unspecified: Secondary | ICD-10-CM

## 2022-05-06 DIAGNOSIS — Z9989 Dependence on other enabling machines and devices: Secondary | ICD-10-CM | POA: Diagnosis not present

## 2022-05-06 DIAGNOSIS — G4733 Obstructive sleep apnea (adult) (pediatric): Secondary | ICD-10-CM | POA: Diagnosis not present

## 2022-05-06 MED ORDER — BEVESPI AEROSPHERE 9-4.8 MCG/ACT IN AERO: INHALATION_SPRAY | RESPIRATORY_TRACT | 3 refills | Status: DC

## 2022-05-06 MED ORDER — ALBUTEROL SULFATE HFA 108 (90 BASE) MCG/ACT IN AERS: 2.0000 | INHALATION_SPRAY | Freq: Four times a day (QID) | RESPIRATORY_TRACT | 12 refills | Status: DC | PRN

## 2022-05-06 NOTE — Assessment & Plan Note (Signed)
Again emphasized smoking cessation with support available.

## 2022-05-06 NOTE — Patient Instructions (Signed)
Bevespi maintenance inhaler and Ventolin rescue inhaler refilled.  Order CXR   dx COPD mixed type  It really is time to stop smoking- please try.  We can continue CPAP auto 5-20  Please call if we can help

## 2022-05-06 NOTE — Assessment & Plan Note (Signed)
Ongoing wheeze and some cough. Plan-continue Bevespi, update CXR

## 2022-05-06 NOTE — Assessment & Plan Note (Signed)
Benefits from CPAP with good compliance and control.  No concerns. Plan-continue auto 5-20

## 2022-05-07 ENCOUNTER — Encounter: Payer: Self-pay | Admitting: *Deleted

## 2022-05-09 ENCOUNTER — Telehealth: Payer: Self-pay | Admitting: Pharmacy Technician

## 2022-05-09 NOTE — Telephone Encounter (Signed)
Ok to refill albuterol hfa rescue inhaler with product preferred by his insurance

## 2022-05-09 NOTE — Telephone Encounter (Signed)
Received PA request for patient's Ventolin inhaler. Plan prefers generic Proair or Proventil HFA. Please advise.

## 2022-05-29 ENCOUNTER — Other Ambulatory Visit: Payer: 59

## 2022-06-16 ENCOUNTER — Telehealth: Payer: Self-pay | Admitting: Nurse Practitioner

## 2022-06-16 NOTE — Telephone Encounter (Signed)
[facility] called and said they spoke with patient because it looks like his renal u/s had been cancelled for tomorrow and he said it was not supposed to be cancelled it is for his urologist and [facility] needs a PA today by 3:00 pm for him to still have that scan tomorrow. If you can call Taryn at [phone] please.

## 2022-06-16 NOTE — Telephone Encounter (Signed)
I called and left a message on Billy Thomas's voicemail. I'm sure she is at lunch. It's a CT renal stone study that was ordered. I have no idea why it was cancelled.

## 2022-06-17 ENCOUNTER — Ambulatory Visit
Admission: RE | Admit: 2022-06-17 | Discharge: 2022-06-17 | Disposition: A | Discharge status: Home / Self Care | Payer: 59 | Source: Ambulatory Visit | Attending: Nurse Practitioner | Admitting: Nurse Practitioner

## 2022-06-17 DIAGNOSIS — N390 Urinary tract infection, site not specified: Secondary | ICD-10-CM

## 2022-06-17 DIAGNOSIS — Z87442 Personal history of urinary calculi: Secondary | ICD-10-CM

## 2022-06-29 NOTE — Progress Notes (Signed)
Please let the patient know  that CT of the abdomen shows bilateral, non obstructing kidney stones. I can refer him to  urology for further evaluation if he would like.  Thanks  -HB

## 2022-08-04 ENCOUNTER — Other Ambulatory Visit: Payer: Self-pay | Admitting: Nurse Practitioner

## 2022-08-04 DIAGNOSIS — F410 Panic disorder [episodic paroxysmal anxiety] without agoraphobia: Secondary | ICD-10-CM

## 2022-08-04 NOTE — Telephone Encounter (Signed)
Reviewed PDMP with no red flags. Last fill 04/17/2022. Fill #30 tablets.

## 2022-09-01 ENCOUNTER — Ambulatory Visit
Admission: EM | Admit: 2022-09-01 | Discharge: 2022-09-01 | Disposition: A | Discharge status: Home / Self Care | Payer: 59 | Attending: Internal Medicine | Admitting: Internal Medicine

## 2022-09-01 ENCOUNTER — Encounter: Payer: Self-pay | Admitting: Emergency Medicine

## 2022-09-01 ENCOUNTER — Telehealth: Payer: Self-pay

## 2022-09-01 DIAGNOSIS — R062 Wheezing: Secondary | ICD-10-CM

## 2022-09-01 DIAGNOSIS — J069 Acute upper respiratory infection, unspecified: Secondary | ICD-10-CM

## 2022-09-01 MED ORDER — PREDNISONE 10 MG (21) PO TBPK: ORAL_TABLET | Freq: Every day | ORAL | 0 refills | Status: DC

## 2022-09-01 MED ORDER — BENZONATATE 100 MG PO CAPS: 100.0000 mg | ORAL_CAPSULE | Freq: Three times a day (TID) | ORAL | 0 refills | Status: DC | PRN

## 2022-09-01 MED ORDER — IPRATROPIUM-ALBUTEROL 0.5-2.5 (3) MG/3ML IN SOLN
3.0000 mL | Freq: Once | RESPIRATORY_TRACT | Status: AC
  Administered 2022-09-01: 3 mL via RESPIRATORY_TRACT

## 2022-09-01 NOTE — Telephone Encounter (Signed)
Patient came into office to see if he could be seem due to him having congestion, I stated to pt that we do not have availability today and that he may be seen in office tomorrow, pt would like to know if he could get some medication sent to his pharmacy, please advise, thanks!

## 2022-09-01 NOTE — Discharge Instructions (Signed)
I have prescribed you prednisone steroid as well as a cough medication to alleviate wheezing and respiratory infection.  Please follow-up if symptoms persist or worsen.

## 2022-09-01 NOTE — ED Provider Notes (Signed)
[facility]    CSN: [id number] Arrival date & time: 09/01/22  1029      History   Chief Complaint Chief Complaint  Patient presents with   Influenza    not sure if the flu or bronchitis or cold - Entered by patient   Nasal Congestion   Cough    HPI Billy Thomas is a 56 y.o. male.   Patient presents with cough and nasal congestion that started about 4 days ago.  Patient also reports that he had some wheezing upon awakening this morning.  Reports intermittent shortness of breath with this.  Denies chest pain, sore throat, ear pain, nausea, vomiting, diarrhea, abdominal pain.  Patient has taken his albuterol inhaler, Mucinex, Tussin with minimal improvement of symptoms.  Patient does report that he has COPD.  Denies any obvious known sick contacts or fever.   Influenza Cough   Past Medical History:  Diagnosis Date   COPD (chronic obstructive pulmonary disease) ([place])    possible; pt states unconfirmed   GERD (gastroesophageal reflux disease)    History of kidney stones    Sleep apnea     Patient Active Problem List   Diagnosis Date Noted   Urinary tract infection with hematuria 05/02/2022   Dyslipidemia, goal LDL below 100 04/09/2022   Impaired fasting glucose 04/09/2022   Tobacco use disorder, continuous 02/16/2022   Body mass index (BMI) of 30.0-30.9 in adult 02/16/2022   Acute non-recurrent pansinusitis 02/16/2022   Elevated glucose 09/21/2018   History of kidney stones 09/07/2018   Healthcare maintenance 07/21/2017   Candidiasis of mouth 05/05/2016   Edema tongue 05/05/2016   Benign prostatic hyperplasia 04/23/2016   GERD (gastroesophageal reflux disease) 04/23/2016   OSA on CPAP 04/23/2016   Tobacco abuse 04/23/2016   Tobacco abuse counseling 04/23/2016   Obesity 04/23/2016   COPD mixed type ([place]) 04/23/2016   Alcoholism in remission ([place]) 04/23/2016   GAD (generalized anxiety disorder) 04/22/2016   Panic attack 04/22/2016   Alcoholism  ([place]) 05/05/1981    Past Surgical History:  Procedure Laterality Date   APPENDECTOMY     BROW LIFT Bilateral 02/01/2021   Procedure: BLEPHAROPLASTY;  Surgeon: Cindra Presume, MD;  Location: [facility];  Service: Plastics;  Laterality: Bilateral;   CARDIAC CATHETERIZATION  2010   RHINOPLASTY     SHOULDER SURGERY Bilateral 10 years ago       Home Medications    Prior to Admission medications   Medication Sig Start Date End Date Taking? Authorizing Provider  benzonatate (TESSALON) 100 MG capsule Take 1 capsule (100 mg total) by mouth every 8 (eight) hours as needed for cough. 09/01/22  Yes Jolissa Kapral, Hildred Alamin E, FNP  predniSONE (STERAPRED UNI-PAK 21 TAB) 10 MG (21) TBPK tablet Take by mouth daily. Take 6 tabs by mouth daily  for 2 days, then 5 tabs for 2 days, then 4 tabs for 2 days, then 3 tabs for 2 days, 2 tabs for 2 days, then 1 tab by mouth daily for 2 days 09/01/22  Yes Tariah Transue, Michele Rockers, FNP  albuterol (VENTOLIN HFA) 108 (90 Base) MCG/ACT inhaler Inhale 2 puffs into the lungs every 6 (six) hours as needed for wheezing or shortness of breath. 05/06/22   Baird Lyons D, MD  ALPRAZolam (XANAX) 0.25 MG tablet TAKE 1 TABLET (0.25 MG TOTAL) BY MOUTH DAILY AS NEEDED. 08/04/22   Ronnell Freshwater, NP  azithromycin (ZITHROMAX) 250 MG tablet z-pack - take as directed for 5 days  02/05/22   Ronnell Freshwater, NP  Glycopyrrolate-Formoterol (BEVESPI AEROSPHERE) 9-4.8 MCG/ACT AERO INHALE 2 PUFFS INTO THE LUNGS 2 TIMES A DAY 05/06/22   Young, Tarri Fuller D, MD  sulfamethoxazole-trimethoprim (BACTRIM DS) 800-160 MG tablet Take 1 tablet by mouth 2 (two) times daily. 04/29/22   Ronnell Freshwater, NP  tamsulosin (FLOMAX) 0.4 MG CAPS capsule Take 1 capsule (0.4 mg total) by mouth daily. Patient not taking: Reported on 05/06/2022 04/29/22   Ronnell Freshwater, NP    Family History Family History  Problem Relation Age of Onset   Cancer Father        lung   Heart attack Paternal Grandfather    Heart  disease Paternal Grandfather     Social History Social History   Tobacco Use   Smoking status: Every Day    Packs/day: 1.00    Years: 32.00    Total pack years: 32.00    Types: Cigarettes   Smokeless tobacco: Never  Vaping Use   Vaping Use: Never used  Substance Use Topics   Alcohol use: No   Drug use: No     Allergies   Hydrocodone   Review of Systems Review of Systems Per HPI  Physical Exam Triage Vital Signs ED Triage Vitals  Enc Vitals Group     BP 09/01/22 1052 122/67     Pulse Rate 09/01/22 1052 61     Resp 09/01/22 1052 18     Temp 09/01/22 1052 97.9 F (36.6 C)     Temp src --      SpO2 09/01/22 1052 95 %     Weight --      Height --      Head Circumference --      Peak Flow --      Pain Score 09/01/22 1051 0     Pain Loc --      Pain Edu? --      Excl. in [place]? --    No data found.  Updated Vital Signs BP 122/67   Pulse 61   Temp 97.9 F (36.6 C)   Resp 18   SpO2 95%   Visual Acuity Right Eye Distance:   Left Eye Distance:   Bilateral Distance:    Right Eye Near:   Left Eye Near:    Bilateral Near:     Physical Exam Constitutional:      General: He is not in acute distress.    Appearance: Normal appearance. He is not toxic-appearing or diaphoretic.  HENT:     Head: Normocephalic and atraumatic.     Right Ear: Tympanic membrane and ear canal normal.     Left Ear: Tympanic membrane and ear canal normal.     Nose: Congestion present.     Mouth/Throat:     Mouth: Mucous membranes are moist.     Pharynx: No posterior oropharyngeal erythema.  Eyes:     Extraocular Movements: Extraocular movements intact.     Conjunctiva/sclera: Conjunctivae normal.     Pupils: Pupils are equal, round, and reactive to light.  Cardiovascular:     Rate and Rhythm: Normal rate and regular rhythm.     Pulses: Normal pulses.     Heart sounds: Normal heart sounds.  Pulmonary:     Effort: Pulmonary effort is normal. No respiratory distress.     Breath  sounds: No stridor. Wheezing present. No rhonchi or rales.     Comments: Mild wheezing noted on auscultation.  Abdominal:  General: Abdomen is flat. Bowel sounds are normal.     Palpations: Abdomen is soft.  Musculoskeletal:        General: Normal range of motion.     Cervical back: Normal range of motion.  Skin:    General: Skin is warm and dry.  Neurological:     General: No focal deficit present.     Mental Status: He is alert and oriented to person, place, and time. Mental status is at baseline.  Psychiatric:        Mood and Affect: Mood normal.        Behavior: Behavior normal.      UC Treatments / Results  Labs (all labs ordered are listed, but only abnormal results are displayed) Labs Reviewed - No data to display  EKG   Radiology No results found.  Procedures Procedures (including critical care time)  Medications Ordered in UC Medications  ipratropium-albuterol (DUONEB) 0.5-2.5 (3) MG/3ML nebulizer solution 3 mL (3 mLs Nebulization Given 09/01/22 1118)    Initial Impression / Assessment and Plan / UC Course  I have reviewed the triage vital signs and the nursing notes.  Pertinent labs & imaging results that were available during my care of the patient were reviewed by me and considered in my medical decision making (see chart for details).     Patient presents with symptoms likely from a viral upper respiratory infection. Differential includes bacterial pneumonia, sinusitis, allergic rhinitis, COVID-19, flu, RSV.  Patient is nontoxic appearing and not in need of emergent medical intervention.  Patient had wheezing on exam so DuoNeb was administered with improvement in lung sounds and patient stating that he felt better.  Patient declined COVID testing as he reports he took a home COVID test that was negative.  Recommended symptom control with over-the-counter medications.  I also think patient would benefit from prednisone given wheezing noted on exam which  is caused by inflammation by a viral illness.  Patient reports he has taken this before and tolerated well.  Tessalon perles to take as needed for cough.  No respiratory distress noted and oxygen is normal so patient stable to discharge.  Return if symptoms fail to improve.  Patient states understanding and is agreeable.  Discharged with PCP followup.  Final Clinical Impressions(s) / UC Diagnoses   Final diagnoses:  Wheezing  Viral upper respiratory tract infection with cough     Discharge Instructions      I have prescribed you prednisone steroid as well as a cough medication to alleviate wheezing and respiratory infection.  Please follow-up if symptoms persist or worsen.    ED Prescriptions     Medication Sig Dispense Auth. Provider   predniSONE (STERAPRED UNI-PAK 21 TAB) 10 MG (21) TBPK tablet Take by mouth daily. Take 6 tabs by mouth daily  for 2 days, then 5 tabs for 2 days, then 4 tabs for 2 days, then 3 tabs for 2 days, 2 tabs for 2 days, then 1 tab by mouth daily for 2 days 42 tablet Maurya Nethery, [place] E, [place]   benzonatate (TESSALON) 100 MG capsule Take 1 capsule (100 mg total) by mouth every 8 (eight) hours as needed for cough. 21 capsule Nelson, Michele Rockers, [place]      PDMP not reviewed this encounter.   Teodora Medici, [place] 09/01/22 1259

## 2022-09-01 NOTE — Telephone Encounter (Signed)
Called pt spoke to wife pt have appt for this week

## 2022-09-01 NOTE — ED Triage Notes (Signed)
Pt is present today with c/o cough and congestion. Pt sx started Friday

## 2022-09-02 ENCOUNTER — Ambulatory Visit: Payer: 59 | Admitting: Nurse Practitioner

## 2022-09-25 ENCOUNTER — Encounter: Payer: Self-pay | Admitting: Gastroenterology

## 2022-10-14 ENCOUNTER — Ambulatory Visit (AMBULATORY_SURGERY_CENTER): Payer: Self-pay

## 2022-10-14 VITALS — Ht 72.0 in | Wt 210.0 lb

## 2022-10-14 DIAGNOSIS — Z1211 Encounter for screening for malignant neoplasm of colon: Secondary | ICD-10-CM

## 2022-10-14 MED ORDER — NA SULFATE-K SULFATE-MG SULF 17.5-3.13-1.6 GM/177ML PO SOLN: 1.0000 | Freq: Once | ORAL | 0 refills | Status: AC

## 2022-10-14 NOTE — Progress Notes (Signed)
No egg or soy allergy known to patient;  No issues known to pt with past sedation with any surgeries or procedures; Patient denies ever being told they had issues or difficulty with intubation;  No FH of Malignant Hyperthermia; Pt is not on diet pills; Pt is not on home 02;  Pt is not on blood thinners;  Pt denies issues with constipation;  No A fib or A flutter; Have any cardiac testing pending--NO Pt instructed to use [employer] or GoodRx for a price reduction on prep;   Insurance verified during [place] appt=[employer]  Patient's chart reviewed by Osvaldo Angst CNRA prior to previsit and patient appropriate for the [place].  Previsit completed and red dot placed by patient's name on their procedure day (on provider's schedule).

## 2022-10-24 ENCOUNTER — Encounter: Payer: Self-pay | Admitting: Gastroenterology

## 2022-10-29 ENCOUNTER — Ambulatory Visit (AMBULATORY_SURGERY_CENTER): Payer: 59 | Admitting: Gastroenterology

## 2022-10-29 ENCOUNTER — Encounter: Payer: Self-pay | Admitting: Gastroenterology

## 2022-10-29 VITALS — BP 94/61 | HR 61 | Temp 98.6°F | Resp 15 | Ht 72.0 in | Wt 210.0 lb

## 2022-10-29 DIAGNOSIS — Z1211 Encounter for screening for malignant neoplasm of colon: Secondary | ICD-10-CM

## 2022-10-29 DIAGNOSIS — D129 Benign neoplasm of anus and anal canal: Secondary | ICD-10-CM

## 2022-10-29 DIAGNOSIS — D124 Benign neoplasm of descending colon: Secondary | ICD-10-CM | POA: Diagnosis not present

## 2022-10-29 DIAGNOSIS — D122 Benign neoplasm of ascending colon: Secondary | ICD-10-CM

## 2022-10-29 DIAGNOSIS — D128 Benign neoplasm of rectum: Secondary | ICD-10-CM

## 2022-10-29 DIAGNOSIS — D120 Benign neoplasm of cecum: Secondary | ICD-10-CM | POA: Diagnosis not present

## 2022-10-29 DIAGNOSIS — K621 Rectal polyp: Secondary | ICD-10-CM | POA: Diagnosis not present

## 2022-10-29 DIAGNOSIS — D125 Benign neoplasm of sigmoid colon: Secondary | ICD-10-CM

## 2022-10-29 HISTORY — PX: COLONOSCOPY: SHX174

## 2022-10-29 MED ORDER — SODIUM CHLORIDE 0.9 % IV SOLN: 500.0000 mL | INTRAVENOUS | Status: DC

## 2022-10-29 NOTE — Patient Instructions (Signed)
Handout on polyps given to you today  Await pathology results   YOU HAD AN ENDOSCOPIC PROCEDURE TODAY AT [facility]:   Refer to the procedure report that was given to you for any specific questions about what was found during the examination.  If the procedure report does not answer your questions, please call your gastroenterologist to clarify.  If you requested that your care partner not be given the details of your procedure findings, then the procedure report has been included in a sealed envelope for you to review at your convenience later.  YOU SHOULD EXPECT: Some feelings of bloating in the abdomen. Passage of more gas than usual.  Walking can help get rid of the air that was put into your GI tract during the procedure and reduce the bloating. If you had a lower endoscopy (such as a colonoscopy or flexible sigmoidoscopy) you may notice spotting of blood in your stool or on the toilet paper. If you underwent a bowel prep for your procedure, you may not have a normal bowel movement for a few days.  Please Note:  You might notice some irritation and congestion in your nose or some drainage.  This is from the oxygen used during your procedure.  There is no need for concern and it should clear up in a day or so.  SYMPTOMS TO REPORT IMMEDIATELY:  Following lower endoscopy (colonoscopy or flexible sigmoidoscopy):  Excessive amounts of blood in the stool  Significant tenderness or worsening of abdominal pains  Swelling of the abdomen that is new, acute  Fever of 100F or higher  For urgent or emergent issues, a gastroenterologist can be reached at any hour by calling [phone]. Do not use MyChart messaging for urgent concerns.    DIET:  We do recommend a small meal at first, but then you may proceed to your regular diet.  Drink plenty of fluids but you should avoid alcoholic beverages for 24 hours.  ACTIVITY:  You should plan to take it easy for the rest of today and you  should NOT DRIVE or use heavy machinery until tomorrow (because of the sedation medicines used during the test).    FOLLOW UP: Our staff will call the number listed on your records the next business day following your procedure.  We will call around 7:15- 8:00 am to check on you and address any questions or concerns that you may have regarding the information given to you following your procedure. If we do not reach you, we will leave a message.     If any biopsies were taken you will be contacted by phone or by letter within the next 1-3 weeks.  Please call us at [phone] if you have not heard about the biopsies in 3 weeks.    SIGNATURES/CONFIDENTIALITY: You and/or your care partner have signed paperwork which will be entered into your electronic medical record.  These signatures attest to the fact that that the information above on your After Visit Summary has been reviewed and is understood.  Full responsibility of the confidentiality of this discharge information lies with you and/or your care-partner.

## 2022-10-29 NOTE — Progress Notes (Signed)
Pt's states no medical or surgical changes since previsit or office visit. 

## 2022-10-29 NOTE — Op Note (Signed)
[facility] Patient Name: Billy Thomas Procedure Date: 10/29/2022 9:36 AM MRN: [id number] Endoscopist: Nicki Reaper E. Candis Schatz , MD, [id number] Age: 56 Referring MD:  Date of Birth: 02-17-1966 Gender: Male Account #: [id number] Procedure:                Colonoscopy Indications:              Screening for colorectal malignant neoplasm, This                            is the patient's first colonoscopy Medicines:                Monitored Anesthesia Care Procedure:                Pre-Anesthesia Assessment:                           - Prior to the procedure, a History and Physical                            was performed, and patient medications and                            allergies were reviewed. The patient's tolerance of                            previous anesthesia was also reviewed. The risks                            and benefits of the procedure and the sedation                            options and risks were discussed with the patient.                            All questions were answered, and informed consent                            was obtained. Prior Anticoagulants: The patient has                            taken no anticoagulant or antiplatelet agents. ASA                            Grade Assessment: III - A patient with severe                            systemic disease. After reviewing the risks and                            benefits, the patient was deemed in satisfactory                            condition to undergo the procedure.  After obtaining informed consent, the colonoscope                            was passed under direct vision. Throughout the                            procedure, the patient's blood pressure, pulse, and                            oxygen saturations were monitored continuously. The                            Olympus CF-HQ190L ([id number]) Colonoscope was                            introduced through  the anus and advanced to the the                            cecum, identified by appendiceal orifice and                            ileocecal valve. The colonoscopy was performed with                            moderate difficulty due to significant looping.                            Successful completion of the procedure was aided by                            using manual pressure. The patient tolerated the                            procedure well. The quality of the bowel                            preparation was adequate. The ileocecal valve,                            appendiceal orifice, and rectum were photographed.                            The bowel preparation used was SUPREP via split                            dose instruction. Scope In: 9:44:43 AM Scope Out: 10:15:55 AM Scope Withdrawal Time: 0 hours 23 minutes 55 seconds  Total Procedure Duration: 0 hours 31 minutes 12 seconds  Findings:                 The perianal and digital rectal examinations were                            normal. Pertinent negatives include normal  sphincter tone and no palpable rectal lesions.                           A 5 mm polyp was found in the cecum. The polyp was                            sessile. The polyp was removed with a cold snare.                            Resection and retrieval were complete. Estimated                            blood loss was minimal. The pathology specimen was                            placed into Bottle Number 1.                           A 2 mm polyp was found in the ascending colon. The                            polyp was sessile. The polyp was removed with a                            cold snare. Resection and retrieval were complete.                            Estimated blood loss was minimal. The pathology                            specimen was placed into Bottle Number 1.                           Two sessile polyps were  found in the descending                            colon. The polyps were 6 to 8 mm in size. These                            polyps were removed with a cold snare. Resection                            and retrieval were complete. Estimated blood loss                            was minimal. The pathology specimen was placed into                            Bottle Number 1.                           A 7 mm polyp was found in the sigmoid colon. The  polyp was sessile. The polyp was removed with a                            cold snare. Resection and retrieval were complete.                            The pathology specimen was placed into Bottle                            Number 2. Estimated blood loss was minimal.                           A 4 mm polyp was found in the rectum. The polyp was                            sessile. The polyp was removed with a cold snare.                            Resection and retrieval were complete. The                            pathology specimen was placed into Bottle Number 2.                            Estimated blood loss was minimal.                           The exam was otherwise normal throughout the                            examined colon.                           The retroflexed view of the distal rectum and anal                            verge was normal and showed no anal or rectal                            abnormalities. Complications:            No immediate complications. Estimated Blood Loss:     Estimated blood loss was minimal. Impression:               - One 5 mm polyp in the cecum, removed with a cold                            snare. Resected and retrieved.                           - One 2 mm polyp in the ascending colon, removed                            with a cold snare. Resected and  retrieved.                           - Two 6 to 8 mm polyps in the descending colon,                            removed  with a cold snare. Resected and retrieved.                           - One 7 mm polyp in the sigmoid colon, removed with                            a cold snare. Resected and retrieved.                           - One 4 mm polyp in the rectum, removed with a cold                            snare. Resected and retrieved.                           - The distal rectum and anal verge are normal on                            retroflexion view. Recommendation:           - Patient has a contact number available for                            emergencies. The signs and symptoms of potential                            delayed complications were discussed with the                            patient. Return to normal activities tomorrow.                            Written discharge instructions were provided to the                            patient.                           - Resume previous diet.                           - Continue present medications.                           - Await pathology results.                           - Repeat colonoscopy (date not yet determined) for  surveillance based on pathology results. Aiko Belko E. Candis Schatz, MD 10/29/2022 10:22:26 AM This report has been signed electronically.

## 2022-10-29 NOTE — Progress Notes (Signed)
Sedate, gd SR, tolerated procedure well, VSS, report to RN 

## 2022-10-29 NOTE — Progress Notes (Signed)
Called to room to assist during endoscopic procedure.  Patient ID and intended procedure confirmed with present staff. Received instructions for my participation in the procedure from the performing physician.  

## 2022-10-29 NOTE — Progress Notes (Signed)
[facility] History and Physical   Primary Care Physician:  Ronnell Freshwater, NP   Reason for Procedure:   Colon cancer screening  Plan:    Screening colonoscopy     HPI: Billy Thomas is a 56 y.o. male undergoing initial average risk screening colonoscopy.  He has no family history of colon cancer and no chronic GI symptoms.    Past Medical History:  Diagnosis Date   Anxiety    situational anxiety   COPD (chronic obstructive pulmonary disease) ([place])    possible; pt states unconfirmed-PRN inhaler   GERD (gastroesophageal reflux disease)    OTC PRN meds-   History of kidney stones    Kidney stones 2023   Sleep apnea     Past Surgical History:  Procedure Laterality Date   APPENDECTOMY  2005   BROW LIFT Bilateral 02/01/2021   Procedure: BLEPHAROPLASTY;  Surgeon: Cindra Presume, MD;  Location: [place];  Service: Plastics;  Laterality: Bilateral;   CARDIAC CATHETERIZATION  2010   CYSTOSCOPY WITH HOLMIUM LASER LITHOTRIPSY  2015   NASAL SEPTOPLASTY W/ TURBINOPLASTY  [place]   SHOULDER SURGERY Bilateral 2005   RIGHT 2005/ LEFT 2006   WISDOM TOOTH EXTRACTION  2003    Prior to Admission medications   Medication Sig Start Date End Date Taking? Authorizing Provider  Glycopyrrolate-Formoterol (BEVESPI AEROSPHERE) 9-4.8 MCG/ACT AERO INHALE 2 PUFFS INTO THE LUNGS 2 TIMES A DAY Patient taking differently: Inhale 2 puffs into the lungs 2 (two) times daily as needed. INHALE 2 PUFFS INTO THE LUNGS 2 TIMES A DAY 05/06/22  Yes Young, Clinton D, MD  omeprazole (PRILOSEC) 20 MG capsule Take 40 mg by mouth daily.   Yes [provider]  albuterol (VENTOLIN HFA) 108 (90 Base) MCG/ACT inhaler Inhale 2 puffs into the lungs every 6 (six) hours as needed for wheezing or shortness of breath. 05/06/22   Young, Kasandra Knudsen, MD  ALPRAZolam (XANAX) 0.25 MG tablet TAKE 1 TABLET (0.25 MG TOTAL) BY MOUTH DAILY AS NEEDED. 08/04/22   Ronnell Freshwater,  NP  Multiple Vitamin (MULTIVITAMIN) capsule Take 1 capsule by mouth in the morning, at noon, and at bedtime.    [provider]    Current Outpatient Medications  Medication Sig Dispense Refill   Glycopyrrolate-Formoterol (BEVESPI AEROSPHERE) 9-4.8 MCG/ACT AERO INHALE 2 PUFFS INTO THE LUNGS 2 TIMES A DAY (Patient taking differently: Inhale 2 puffs into the lungs 2 (two) times daily as needed. INHALE 2 PUFFS INTO THE LUNGS 2 TIMES A DAY) 32.1 g 3   omeprazole (PRILOSEC) 20 MG capsule Take 40 mg by mouth daily.     albuterol (VENTOLIN HFA) 108 (90 Base) MCG/ACT inhaler Inhale 2 puffs into the lungs every 6 (six) hours as needed for wheezing or shortness of breath. 8 g 12   ALPRAZolam (XANAX) 0.25 MG tablet TAKE 1 TABLET (0.25 MG TOTAL) BY MOUTH DAILY AS NEEDED. 30 tablet 0   Multiple Vitamin (MULTIVITAMIN) capsule Take 1 capsule by mouth in the morning, at noon, and at bedtime.     Current Facility-Administered Medications  Medication Dose Route Frequency Provider Last Rate Last Admin   0.9 %  sodium chloride infusion  500 mL Intravenous Continuous Daryel November, MD        Allergies as of 10/29/2022 - Review Complete 10/29/2022  Allergen Reaction Noted   Hydrocodone Nausea Only 04/22/2016    Family History  Problem Relation Age of Onset  Colon polyps Mother 56   Esophageal cancer Father 76       smoker[place]   Lung cancer Father 34       smoker- also grew [place] a [place]   Heart attack Paternal Grandfather    Heart disease Paternal Grandfather    Colon cancer Neg Hx    Stomach cancer Neg Hx    Rectal cancer Neg Hx     Social History   Socioeconomic History   Marital status: Married    Spouse name: Not on file   Number of children: Not on file   Years of education: Not on file   Highest education level: Not on file  Occupational History   Not on file  Tobacco Use   Smoking status: Every Day    Packs/day: 2.00    Years: 32.00     Total pack years: 64.00    Types: Cigarettes   Smokeless tobacco: Never  Vaping Use   Vaping Use: Never used  Substance and Sexual Activity   Alcohol use: No   Drug use: No   Sexual activity: Yes    Birth control/protection: None  Other Topics Concern   Not on file  Social History Narrative   Not on file   Social Determinants of Health   Financial Resource Strain: Not on file  Food Insecurity: Not on file  Transportation Needs: Not on file  Physical Activity: Not on file  Stress: Not on file  Social Connections: Not on file  Intimate Partner Violence: Not on file    Review of Systems:  All other review of systems negative except as mentioned in the HPI.  Physical Exam: Vital signs BP 109/76   Pulse 70   Temp 98.6 F (37 C)   Ht 6' (1.829 m)   Wt 210 lb (95.3 kg)   SpO2 98%   BMI 28.48 kg/m   General:   Alert,  Well-developed, well-nourished, pleasant and cooperative in NAD Airway:  Mallampati 2 Lungs:  Clear throughout to auscultation.   Heart:  Regular rate and rhythm; no murmurs, clicks, rubs,  or gallops. Abdomen:  Soft, nontender and nondistended. Normal bowel sounds.   Neuro/Psych:  Normal mood and affect. A and O x 3   Judee Hennick E. Candis Schatz, MD [facility]

## 2022-10-30 ENCOUNTER — Telehealth: Payer: Self-pay

## 2022-10-30 NOTE — Telephone Encounter (Signed)
  Follow up Call-     10/29/2022    8:33 AM  Call back number  Post procedure Call Back phone  # [phone]  Permission to leave phone message Yes   Follow-up call, LVM

## 2022-10-31 ENCOUNTER — Other Ambulatory Visit: Payer: Self-pay | Admitting: Nurse Practitioner

## 2022-10-31 DIAGNOSIS — F410 Panic disorder [episodic paroxysmal anxiety] without agoraphobia: Secondary | ICD-10-CM

## 2022-11-03 NOTE — Progress Notes (Signed)
Mr. Brase,  Only two of the six polyps which I removed during your recent procedure were proven to be completely benign but are considered "pre-cancerous" polyps that MAY have grown into cancer if they had not been removed.  The other four polyps were hyperplastic polyps, which are not considered precancerous.  Studies shows that at least 20% of women over age 55 and 30% of men over age 72 have pre-cancerous polyps.  Based on current nationally recognized surveillance guidelines, I recommend that you have a repeat colonoscopy in 7 years.   If you develop any new rectal bleeding, abdominal pain or significant bowel habit changes, please contact me before then.

## 2022-11-04 NOTE — Telephone Encounter (Signed)
L.O.V: 04/29/22  N.O.V: 04/14/23  L.R.F: 08/04/22 30 tab 0 refill

## 2022-12-19 ENCOUNTER — Other Ambulatory Visit: Payer: Self-pay | Admitting: Internal Medicine

## 2023-02-18 ENCOUNTER — Other Ambulatory Visit: Payer: Self-pay

## 2023-02-18 ENCOUNTER — Ambulatory Visit (INDEPENDENT_AMBULATORY_CARE_PROVIDER_SITE_OTHER): Payer: 59 | Admitting: Urology

## 2023-02-18 ENCOUNTER — Other Ambulatory Visit
Admission: RE | Admit: 2023-02-18 | Discharge: 2023-02-18 | Disposition: A | Discharge status: Home / Self Care | Payer: 59 | Attending: Urology | Admitting: Urology

## 2023-02-18 ENCOUNTER — Encounter: Payer: Self-pay | Admitting: Urology

## 2023-02-18 VITALS — BP 125/70 | HR 72 | Ht 72.0 in | Wt 222.0 lb

## 2023-02-18 DIAGNOSIS — R300 Dysuria: Secondary | ICD-10-CM | POA: Diagnosis not present

## 2023-02-18 DIAGNOSIS — N410 Acute prostatitis: Secondary | ICD-10-CM

## 2023-02-18 DIAGNOSIS — Z87442 Personal history of urinary calculi: Secondary | ICD-10-CM

## 2023-02-18 DIAGNOSIS — R102 Pelvic and perineal pain: Secondary | ICD-10-CM

## 2023-02-18 DIAGNOSIS — R310 Gross hematuria: Secondary | ICD-10-CM | POA: Diagnosis not present

## 2023-02-18 DIAGNOSIS — R8281 Pyuria: Secondary | ICD-10-CM

## 2023-02-18 DIAGNOSIS — R82998 Other abnormal findings in urine: Secondary | ICD-10-CM

## 2023-02-18 LAB — URINALYSIS, COMPLETE (UACMP) WITH MICROSCOPIC
Bilirubin Urine: NEGATIVE
Glucose, UA: NEGATIVE mg/dL
Ketones, ur: NEGATIVE mg/dL
Leukocytes,Ua: NEGATIVE
Nitrite: NEGATIVE
Protein, ur: NEGATIVE mg/dL
Specific Gravity, Urine: 1.015 (ref 1.005–1.030)
pH: 6 (ref 5.0–8.0)

## 2023-02-18 MED ORDER — TAMSULOSIN HCL 0.4 MG PO CAPS: 0.4000 mg | ORAL_CAPSULE | Freq: Every day | ORAL | 11 refills | Status: DC

## 2023-02-18 MED ORDER — SULFAMETHOXAZOLE-TRIMETHOPRIM 800-160 MG PO TABS: 1.0000 | ORAL_TABLET | Freq: Two times a day (BID) | ORAL | 0 refills | Status: DC

## 2023-02-18 NOTE — Progress Notes (Signed)
02/18/23 1:52 PM   Billy Thomas 23-Aug-1966 [id number]  CC: Renal stone, urinary symptoms, gross hematuria, pelvic pain  HPI: 57 year old male who is transferring his care from [facility] u[facility] and Dr. Diona Fanti secondary to location.  There is outside records are not available to me.  He reports at least a few months of some intermittent pelvic and perineal discomfort, but dark urine/possible blood in the urine, and some left-sided groin pain.  He has a known 1 cm right lower pole nonobstructive stone.  He denies any right-sided flank pain.  He has had multiple kidney stone procedures including shockwave and [facility]teroscopy in the past with [facility].  He self treated with Motrin and Flomax which significantly improved his urinary symptoms, but he still has some residual perineal discomfort.  Urinalysis today shows 6-10 WBC, 11-20 RBC, few bacteria, nitrite negative.  Will send for culture.  PMH: Past Medical History:  Diagnosis Date   Anxiety    situational anxiety   COPD (chronic obstructive pulmonary disease)    possible; pt states unconfirmed-PRN inhaler   GERD (gastroesophageal reflux disease)    OTC PRN meds-   History of kidney stones    Kidney stones 2023   Sleep apnea     Surgical History: Past Surgical History:  Procedure Laterality Date   APPENDECTOMY  2005   BROW LIFT Bilateral 02/01/2021   Procedure: BLEPHAROPLASTY;  Surgeon: Cindra Presume, MD;  Location: [place];  Service: Plastics;  Laterality: Bilateral;   CARDIAC CATHETERIZATION  2010   CYSTOSCOPY WITH HOLMIUM LASER LITHOTRIPSY  2015   NASAL SEPTOPLASTY W/ TURBINOPLASTY  [place]   SHOULDER SURGERY Bilateral 2005   RIGHT 2005/ LEFT 2006   WISDOM TOOTH EXTRACTION  2003   Family History: Family History  Problem Relation Age of Onset   Colon polyps Mother 58   Esophageal cancer Father 27       smoker-[place]   Lung cancer Father 20       smoker-  also [place]   Heart attack Paternal Grandfather    Heart disease Paternal Grandfather    Colon cancer Neg Hx    Stomach cancer Neg Hx    Rectal cancer Neg Hx     Social History:  reports that he has been smoking cigarettes. He has a 64.00 pack-year smoking history. He has been exposed to tobacco smoke. He has never used smokeless tobacco. He reports that he does not drink alcohol and does not use drugs.  Physical Exam: BP 125/70   Pulse 72   Ht 6' (1.829 m)   Wt 222 lb (100.7 kg)   BMI 30.11 kg/m    Constitutional:  Alert and oriented, No acute distress. Cardiovascular: No clubbing, cyanosis, or edema. Respiratory: Normal respiratory effort, no increased work of breathing. GI: Abdomen is soft, nontender, nondistended, no abdominal masses   Laboratory Data: Reviewed, see HPI PSA normal at 0.8 in May 2023.  Pertinent Imaging: I have personally viewed and interpreted the CT scan from August 2023 showing a 1 cm right lower pole renal stone, no hydronephrosis or other urologic abnormalities.  Assessment & Plan:   56 year old male with pelvic pain, occasional dysuria, gross hematuria, dark-colored urine, perineal pain worrisome for possible prostatitis.  Urinalysis does show mild pyuria as well as microscopic hematuria today and will follow-up culture.  CT from August 2023 shows a 1 cm right-sided nonobstructive stone.  We  reviewed possible etiologies including prostatitis, pelvic floor dysfunction, nephrolithiasis, malignancy.  I recommended starting with a 2-week course of Bactrim for possible prostatitis based on his symptoms and urine results, will follow-up urine cultures.  -Bactrim DS twice daily x 2 weeks, follow-up urine culture -Okay to discontinue Flomax if symptoms improved with antibiotics -Close follow-up in 4 to 6 weeks for repeat urinalysis and and symptom check, he understands need for CT urogram and cystoscopy if persistent symptoms, gross  hematuria, or microscopic hematuria and follow-up to evaluate other etiology of his symptoms.   Nickolas Madrid, MD 02/18/2023  [facility] [address], [place], [place] [address] [phone]

## 2023-02-18 NOTE — Patient Instructions (Signed)

## 2023-02-19 LAB — URINE CULTURE: Culture: NO GROWTH

## 2023-02-24 LAB — MISC LABCORP TEST (SEND OUT): Labcorp test code: 86884

## 2023-03-25 ENCOUNTER — Ambulatory Visit (INDEPENDENT_AMBULATORY_CARE_PROVIDER_SITE_OTHER): Payer: 59 | Admitting: Urology

## 2023-03-25 VITALS — BP 108/62 | HR 71 | Ht 72.0 in | Wt 222.0 lb

## 2023-03-25 DIAGNOSIS — Z87442 Personal history of urinary calculi: Secondary | ICD-10-CM

## 2023-03-25 DIAGNOSIS — R102 Pelvic and perineal pain: Secondary | ICD-10-CM | POA: Diagnosis not present

## 2023-03-25 DIAGNOSIS — R3129 Other microscopic hematuria: Secondary | ICD-10-CM | POA: Diagnosis not present

## 2023-03-25 DIAGNOSIS — N410 Acute prostatitis: Secondary | ICD-10-CM

## 2023-03-25 LAB — URINALYSIS, COMPLETE
Bilirubin, UA: NEGATIVE
Glucose, UA: NEGATIVE
Ketones, UA: NEGATIVE
Nitrite, UA: NEGATIVE
Specific Gravity, UA: 1.025 (ref 1.005–1.030)
Urobilinogen, Ur: 0.2 mg/dL (ref 0.2–1.0)
pH, UA: 5.5 (ref 5.0–7.5)

## 2023-03-25 LAB — MICROSCOPIC EXAMINATION: RBC, Urine: >30 /hpf — AB (ref 0–2)

## 2023-03-25 LAB — BLADDER SCAN AMB NON-IMAGING

## 2023-03-25 NOTE — Progress Notes (Signed)
   03/25/2023 1:35 PM   Billy Thomas [birth year] [id number]  Reason for visit: Follow up urinary symptoms, gross hematuria, pelvic pain, possible prostatitis, renal stone  HPI: 57 year old male who was previously followed by Dr. Retta Diones at [facility] urology and transferred his care to [place] in April 2024 based on location.  At that time he was reporting at least a few months of intermittent pelvic and perineal discomfort as well as some dark urine and possible blood in the urine and left-sided groin pain.  He was self treating with Motrin and Flomax which improved his urinary symptoms but he had some persistent perineal discomfort.  Urinalysis showed 6-10 WBC, 11-20 RBC, few bacteria, culture was negative, and he opted for a 2-week course of Bactrim for possible prostatitis, and close follow-up for repeat UA and PVR.  He had a CT in August 2023 that was benign except for an 8 mm right lower pole nonobstructive renal stone.  He reports significant improvement in his symptoms after taking the 2-week course of Bactrim.  He does extensive horseback riding, and has noticed a few intermittent days of some left groin pain, but overall significantly improved from prior to the antibiotics.  Urinalysis today does show 6-10 WBC, greater than 30 RBC, calcium oxalate crystals, few bacteria, nitrite negative, trace leukocytes.  Will send for culture again.  PVR today 0ml.  With his persistent microscopic hematuria, I recommended further evaluation with CT urogram and cystoscopy, especially with his history of kidney stones.  He was in agreement.  We discussed potentially canceling cystoscopy if obvious cause like kidney stone found on CT.  RTC to review CT scan and cystoscopy(can cancel if other obvious etiology seen on CT) If workup completely negative, may have a component of pelvic floor dysfunction/chronic prostatitis  Sondra Come, MD  [facility] [address] [place], [place] [address] [phone]

## 2023-03-25 NOTE — Patient Instructions (Signed)

## 2023-03-30 LAB — CULTURE, URINE COMPREHENSIVE

## 2023-04-01 ENCOUNTER — Ambulatory Visit: Admission: RE | Admit: 2023-04-01 | Payer: 59 | Source: Ambulatory Visit

## 2023-04-02 ENCOUNTER — Ambulatory Visit: Admission: RE | Admit: 2023-04-02 | Payer: 59 | Source: Ambulatory Visit

## 2023-04-03 ENCOUNTER — Other Ambulatory Visit: Payer: 59

## 2023-04-07 ENCOUNTER — Ambulatory Visit
Admission: RE | Admit: 2023-04-07 | Discharge: 2023-04-07 | Disposition: A | Discharge status: Home / Self Care | Payer: 59 | Source: Ambulatory Visit | Attending: Urology | Admitting: Urology

## 2023-04-07 DIAGNOSIS — R3129 Other microscopic hematuria: Secondary | ICD-10-CM | POA: Insufficient documentation

## 2023-04-07 MED ORDER — IOHEXOL 300 MG/ML  SOLN
125.0000 mL | Freq: Once | INTRAMUSCULAR | Status: AC | PRN
  Administered 2023-04-07: 125 mL via INTRAVENOUS

## 2023-04-13 NOTE — Progress Notes (Unsigned)
Complete physical exam   Patient: Billy Thomas   DOB: November 24, 1965   57 y.o. Male  MRN: [id number] Visit Date: 04/14/2023    No chief complaint on file.  Subjective    Billy Thomas is a 57 y.o. male who presents today for a complete physical exam.  He reports consuming a {diet types:17450} diet. {Exercise:19826} He generally feels {well/fairly well/poorly:18703}. He {does/does not:200015} have additional problems to discuss today.   HPI  Annual physical  -will need to have routine, fasting labs.  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  -may be set up for lung cancer screening --check smoking history and when/if he quit smoking.  GAD Takes alprazolam on as needed basis. Likely needs refill for this today.   Past Medical History:  Diagnosis Date   Anxiety    situational anxiety   COPD (chronic obstructive pulmonary disease) ([facility])    possible; pt states unconfirmed-PRN inhaler   GERD (gastroesophageal reflux disease)    OTC PRN meds-   History of kidney stones    Kidney stones 2023   Sleep apnea    Past Surgical History:  Procedure Laterality Date   APPENDECTOMY  2005   BROW LIFT Bilateral 02/01/2021   Procedure: BLEPHAROPLASTY;  Surgeon: Allena Napoleon, MD;  Location: [facility];  Service: Plastics;  Laterality: Bilateral;   CARDIAC CATHETERIZATION  2010   CYSTOSCOPY WITH HOLMIUM LASER LITHOTRIPSY  2015   NASAL SEPTOPLASTY W/ TURBINOPLASTY  1995   RHINOPLASTY  1995   SHOULDER SURGERY Bilateral 2005   RIGHT 2005/ LEFT 2006   WISDOM TOOTH EXTRACTION  2003   Social History   Socioeconomic History   Marital status: Married    Spouse name: Not on file   Number of children: Not on file   Years of education: Not on file   Highest education level: Not on file  Occupational History   Not on file  Tobacco Use   Smoking status: Every Day    Packs/day: 2.00    Years: 32.00    Additional pack years: 0.00    Total pack years:  64.00    Types: Cigarettes    Passive exposure: Current   Smokeless tobacco: Never  Vaping Use   Vaping Use: Never used  Substance and Sexual Activity   Alcohol use: No   Drug use: No   Sexual activity: Yes    Birth control/protection: None  Other Topics Concern   Not on file  Social History Narrative   Not on file   Social Determinants of Health   Financial Resource Strain: Not on file  Food Insecurity: Not on file  Transportation Needs: Not on file  Physical Activity: Not on file  Stress: Not on file  Social Connections: Not on file  Intimate Partner Violence: Not on file   Family Status  Relation Name Status   Mother  Alive   Father  Deceased   Sister  Alive   Brother  Alive   Pat Uncle  Deceased       suicide   Oneal Grout  Deceased       suicide   MGM  Deceased   MGF  Deceased   PGM  Deceased   PGF  Deceased   Daughter  Alive   Daughter  Alive   Neg Hx  (Not Specified)   Family History  Problem Relation Age of Onset   Colon polyps Mother 38   Esophageal cancer Father 57  smoker-[place] a [place]   Lung cancer Father 3       smoker- also grew [place] a [place]   Heart attack Paternal Grandfather    Heart disease Paternal Grandfather    Colon cancer Neg Hx    Stomach cancer Neg Hx    Rectal cancer Neg Hx    Allergies  Allergen Reactions   Hydrocodone Nausea Only    Patient states he can take this medication but will need nausea medication    Patient Care Team: Carlean Jews, NP as PCP - General (Family Medicine)   Medications: Outpatient Medications Prior to Visit  Medication Sig   albuterol (VENTOLIN HFA) 108 (90 Base) MCG/ACT inhaler Inhale 2 puffs into the lungs every 6 (six) hours as needed for wheezing or shortness of breath.   ALPRAZolam (XANAX) 0.25 MG tablet TAKE 1 TABLET (0.25 MG TOTAL) BY MOUTH DAILY AS NEEDED.   Glycopyrrolate-Formoterol (BEVESPI AEROSPHERE) 9-4.8 MCG/ACT AERO USE 2 INHALATIONS BY MOUTH TWICE DAILY    Multiple Vitamin (MULTIVITAMIN) capsule Take 1 capsule by mouth in the morning, at noon, and at bedtime.   omeprazole (PRILOSEC) 20 MG capsule Take 40 mg by mouth daily.   tamsulosin (FLOMAX) 0.4 MG CAPS capsule Take 1 capsule (0.4 mg total) by mouth daily.   No facility-administered medications prior to visit.    Review of Systems  {Labs (Optional):23779}   Objective    There were no vitals filed for this visit. There is no height or weight on file to calculate BMI.  BP Readings from Last 3 Encounters:  03/25/23 108/62  02/18/23 125/70  10/29/22 94/61    Wt Readings from Last 3 Encounters:  03/25/23 222 lb (100.7 kg)  02/18/23 222 lb (100.7 kg)  10/29/22 210 lb (95.3 kg)     Physical Exam  ***  Last depression screening scores   Row Labels 04/29/2022    4:18 PM 04/09/2022    8:58 AM 02/04/2022   11:19 AM  PHQ 2/9 Scores   Section Header. No data exists in this row.     PHQ - 2 Score   0 0 0  PHQ- 9 Score   1 1 2    Last fall risk screening   Row Labels 04/29/2022    4:17 PM  Fall Risk    Section Header. No data exists in this row.   Falls in the past year?   0  Number falls in past yr:   0  Injury with Fall?   0  Risk for fall due to :   No Fall Risks  Follow up   Falls evaluation completed   Last Audit-C alcohol use screening   No data to display    A score of 3 or more in women, and 4 or more in men indicates increased risk for alcohol abuse, EXCEPT if all of the points are from question 1   No results found for any visits on 04/14/23.  Assessment & Plan    Routine Health Maintenance and Physical Exam  Exercise Activities and Dietary recommendations  Goals   None     Immunization History  Administered Date(s) Administered   Influenza,inj,Quad PF,6+ Mos 07/23/2017, 08/26/2018, 12/11/2020   Moderna Sars-Covid-2 Vaccination 06/12/2020, 07/10/2020, 12/20/2020   Pneumococcal Polysaccharide-23 08/26/2018   Tdap 06/14/2007, 07/21/2017    Health  Maintenance  Topic Date Due   HIV Screening  Never done   Hepatitis C Screening  Never done   Lung  Cancer Screening  Never done   Zoster Vaccines- Shingrix (1 of 2) Never done   COVID-19 Vaccine (4 - 2023-24 season) 07/18/2022   INFLUENZA VACCINE  06/18/2023   DTaP/Tdap/Td (3 - Td or Tdap) 07/22/2027   Colonoscopy  10/29/2029   HPV VACCINES  Aged Out    Discussed health benefits of physical activity, and encouraged him to engage in regular exercise appropriate for his age and condition.  There are no diagnoses linked to this encounter.  No follow-ups on file.        Carlean Jews, NP  [facility] [phone] (phone) [phone] (fax)  [facility]

## 2023-04-14 ENCOUNTER — Ambulatory Visit (INDEPENDENT_AMBULATORY_CARE_PROVIDER_SITE_OTHER): Payer: 59 | Admitting: Nurse Practitioner

## 2023-04-14 ENCOUNTER — Encounter: Payer: Self-pay | Admitting: Nurse Practitioner

## 2023-04-14 VITALS — BP 99/62 | HR 60 | Ht 72.0 in | Wt 219.8 lb

## 2023-04-14 DIAGNOSIS — F410 Panic disorder [episodic paroxysmal anxiety] without agoraphobia: Secondary | ICD-10-CM

## 2023-04-14 DIAGNOSIS — Z0001 Encounter for general adult medical examination with abnormal findings: Secondary | ICD-10-CM | POA: Diagnosis not present

## 2023-04-14 DIAGNOSIS — R7301 Impaired fasting glucose: Secondary | ICD-10-CM | POA: Diagnosis not present

## 2023-04-14 DIAGNOSIS — R5383 Other fatigue: Secondary | ICD-10-CM | POA: Diagnosis not present

## 2023-04-14 DIAGNOSIS — E785 Hyperlipidemia, unspecified: Secondary | ICD-10-CM

## 2023-04-14 DIAGNOSIS — N401 Enlarged prostate with lower urinary tract symptoms: Secondary | ICD-10-CM

## 2023-04-14 DIAGNOSIS — F411 Generalized anxiety disorder: Secondary | ICD-10-CM

## 2023-04-14 DIAGNOSIS — Z125 Encounter for screening for malignant neoplasm of prostate: Secondary | ICD-10-CM

## 2023-04-14 DIAGNOSIS — J449 Chronic obstructive pulmonary disease, unspecified: Secondary | ICD-10-CM

## 2023-04-14 MED ORDER — ALPRAZOLAM 0.25 MG PO TABS: 0.2500 mg | ORAL_TABLET | Freq: Every day | ORAL | 2 refills | Status: DC | PRN

## 2023-04-14 NOTE — Assessment & Plan Note (Signed)
Check labs with HgbA1c  

## 2023-04-14 NOTE — Assessment & Plan Note (Signed)
Patient currently seeing pulmonology for management

## 2023-04-14 NOTE — Assessment & Plan Note (Signed)
PDMP reviewed today 04/14/2023 -accidental overdose risk score is average at 370.  -no red flags or state indicators.  -may continue to take alprazolam 0.25 mg as needed and as prescribed. New prescription sent to his pharmacy today

## 2023-04-14 NOTE — Assessment & Plan Note (Signed)
Prostatic hypertrophy noted on recent CT scan -PSA to be checked with labs today  -follow up with urology as scheduled.

## 2023-04-15 LAB — TSH+FREE T4
Free T4: 1.2 ng/dL (ref 0.82–1.77)
TSH: 1.78 u[IU]/mL (ref 0.450–4.500)

## 2023-04-15 LAB — LIPID PANEL
Chol/HDL Ratio: 4 ratio (ref 0.0–5.0)
Cholesterol, Total: 196 mg/dL (ref 100–199)
HDL: 49 mg/dL (ref >39)
LDL Chol Calc (NIH): 125 mg/dL — ABNORMAL HIGH (ref 0–99)
Triglycerides: 123 mg/dL (ref 0–149)
VLDL Cholesterol Cal: 22 mg/dL (ref 5–40)

## 2023-04-15 LAB — COMPREHENSIVE METABOLIC PANEL
ALT: 16 IU/L (ref 0–44)
AST: 17 IU/L (ref 0–40)
Albumin/Globulin Ratio: 2.1 (ref 1.2–2.2)
Albumin: 4.5 g/dL (ref 3.8–4.9)
Alkaline Phosphatase: 81 IU/L (ref 44–121)
BUN/Creatinine Ratio: 17 (ref 9–20)
BUN: 15 mg/dL (ref 6–24)
Bilirubin Total: 0.3 mg/dL (ref 0.0–1.2)
CO2: 23 mmol/L (ref 20–29)
Calcium: 9.1 mg/dL (ref 8.7–10.2)
Chloride: 105 mmol/L (ref 96–106)
Creatinine, Ser: 0.87 mg/dL (ref 0.76–1.27)
Globulin, Total: 2.1 g/dL (ref 1.5–4.5)
Glucose: 106 mg/dL — ABNORMAL HIGH (ref 70–99)
Potassium: 4.3 mmol/L (ref 3.5–5.2)
Sodium: 142 mmol/L (ref 134–144)
Total Protein: 6.6 g/dL (ref 6.0–8.5)
eGFR: 102 mL/min/{1.73_m2} (ref >59)

## 2023-04-15 LAB — CBC WITH DIFFERENTIAL/PLATELET
Basophils Absolute: 0 10*3/uL (ref 0.0–0.2)
Basos: 0 %
EOS (ABSOLUTE): 0.1 10*3/uL (ref 0.0–0.4)
Eos: 2 %
Hematocrit: 40.4 % (ref 37.5–51.0)
Hemoglobin: 13.6 g/dL (ref 13.0–17.7)
Immature Grans (Abs): 0 10*3/uL (ref 0.0–0.1)
Immature Granulocytes: 0 %
Lymphocytes Absolute: 1.7 10*3/uL (ref 0.7–3.1)
Lymphs: 35 %
MCH: 32.7 pg (ref 26.6–33.0)
MCHC: 33.7 g/dL (ref 31.5–35.7)
MCV: 97 fL (ref 79–97)
Monocytes Absolute: 0.4 10*3/uL (ref 0.1–0.9)
Monocytes: 9 %
Neutrophils Absolute: 2.5 10*3/uL (ref 1.4–7.0)
Neutrophils: 54 %
Platelets: 206 10*3/uL (ref 150–450)
RBC: 4.16 x10E6/uL (ref 4.14–5.80)
RDW: 13.1 % (ref 11.6–15.4)
WBC: 4.7 10*3/uL (ref 3.4–10.8)

## 2023-04-15 LAB — HEMOGLOBIN A1C
Est. average glucose Bld gHb Est-mCnc: 128 mg/dL
Hgb A1c MFr Bld: 6.1 % — ABNORMAL HIGH (ref 4.8–5.6)

## 2023-04-15 LAB — PSA: Prostate Specific Ag, Serum: 0.5 ng/mL (ref 0.0–4.0)

## 2023-04-16 ENCOUNTER — Other Ambulatory Visit: Payer: Self-pay | Admitting: Urology

## 2023-04-16 ENCOUNTER — Telehealth: Payer: Self-pay

## 2023-04-16 ENCOUNTER — Ambulatory Visit (INDEPENDENT_AMBULATORY_CARE_PROVIDER_SITE_OTHER): Payer: 59 | Admitting: Urology

## 2023-04-16 ENCOUNTER — Encounter: Payer: Self-pay | Admitting: Urology

## 2023-04-16 VITALS — BP 117/69 | HR 72 | Ht 72.0 in | Wt 219.0 lb

## 2023-04-16 DIAGNOSIS — N200 Calculus of kidney: Secondary | ICD-10-CM | POA: Diagnosis not present

## 2023-04-16 LAB — URINALYSIS, COMPLETE
Ketones, UA: NEGATIVE
Specific Gravity, UA: 1.025 (ref 1.005–1.030)

## 2023-04-16 LAB — MICROSCOPIC EXAMINATION

## 2023-04-16 NOTE — Progress Notes (Signed)
Surgical Physician Order Form [facility]  * Scheduling expectation :  Patient preference, within the next few months  *Length of Case: 1 hour  *Clearance needed: no  *Anticoagulation Instructions: May continue all anticoagulants  *Aspirin Instructions: Ok to continue all  *Post-op visit Date/Instructions:   tbd  *Diagnosis: Right Nephrolithiasis  *Procedure: right Ureteroscopy w/laser lithotripsy & stent placement (44034)   Additional orders: N/A  -Admit type: OUTpatient  -Anesthesia: General  -VTE Prophylaxis Standing Order SCD's       Other:   -Standing Lab Orders Per Anesthesia    Lab other: UA&Urine Culture  -Standing Test orders EKG/Chest x-ray per Anesthesia       Test other:   - Medications:  Ancef 2gm IV  -Other orders:  N/A

## 2023-04-16 NOTE — Patient Instructions (Signed)
Laser Therapy for Kidney Stones Laser therapy for kidney stones is a procedure to break up rock-like masses that form inside the kidneys (kidney stones). It is done using a device that beams a strong light (laser) on the kidney stones. This breaks the stones up into small pieces. These small pieces may leave your body when you pee (urinate) or may be taken out during the procedure.  You may need laser therapy if you have kidney stones that are painful or that are stopping you from being able to pee. Tell a health care provider about: Any allergies you have. All medicines you are taking, including vitamins, herbs, eye drops, creams, and over-the-counter medicines. Any problems you or family members have had with anesthesia. Any bleeding problems you have. Any surgeries you have had. Any medical conditions you have. Whether you are pregnant or may be pregnant. What are the risks? Your health care provider will talk with you about risks. These may include: Infection. Bleeding. Allergic reactions to medicines. Damage to: The part of your body that drains pee (urine) from the bladder (urethra). The bladder. The tube that connects the bladder to the kidneys (ureter). Urinary tract infection (UTI). Urethral stricture. This is when the urethra is narrowed by scarring. Trouble peeing. Blockage of the kidney. This may be caused by a piece of kidney stone. What happens before the procedure? When to stop eating and drinking Follow instructions from your provider about what you may eat and drink. These may include: 8 hours before the procedure Stop eating most foods. Do not eat meat, fried foods, or fatty foods. Eat only light foods, such as toast or crackers. All liquids are okay except energy drinks and alcohol. 6 hours before the procedure Stop eating. Drink only clear liquids, such as water, clear fruit juice, black coffee, plain tea, and sports drinks. Do not drink energy drinks or  alcohol. 2 hours before the procedure Stop drinking all liquids. You may be allowed to take medicines with small sips of water. If you do not follow your provider's instructions, your procedure may be delayed or canceled. Medicines Ask your provider about: Changing or stopping your regular medicines. These include any diabetes medicines or blood thinners you take. Taking medicines such as aspirin and ibuprofen. These medicines can thin your blood. Do not take them unless your provider tells you to. Taking over-the-counter medicines, vitamins, herbs, and supplements. Tests You may have a physical exam before the procedure. You may also have tests done. These may include: Imaging tests. Blood or pee tests. Surgery safety Ask your provider: How your surgery site will be marked. What steps will be taken to help prevent infection. These steps may include: Removing hair at the surgery site. Washing skin with a soap that kills germs. Taking antibiotics. General instructions Do not use any products that contain nicotine or tobacco for at least 4 weeks before the procedure. These products include cigarettes, chewing tobacco, and vaping devices, such as e-cigarettes. If you need help quitting, ask your provider. If you will be going home right after the procedure, plan to have a responsible adult: Take you home from the hospital or clinic. You will not be allowed to drive. Care for you for the time you are told. What happens during the procedure?  An IV will be inserted into one of your veins. You will be given: A sedative. This helps you relax. Anesthesia. This keeps you from feeling pain. It will make you fall asleep for surgery. A tool   with a camera on the end (ureteroscope) will be put into your urethra. It will be moved through your bladder to your kidney. It will send pictures to a screen in the operating room. This will show what parts of your kidney need to be treated. A tube will be  put through the ureteroscope. It will be moved into your kidney. The laser device will be put into your kidney through the tube. The laser will be used to break up the kidney stones. A tool with a tiny wire basket may be put through the tube into your kidney. This can help remove the small pieces of the kidney stone. A small mesh tube (stent) may be placed to allow your kidney to drain. The tube and ureteroscope will be taken out at the end of the surgery. The procedure may vary among providers and hospitals. What happens after the procedure? Your blood pressure, heart rate, breathing rate, and blood oxygen level will be monitored until you leave the hospital or clinic. If you had a stent placed, it may have a string that will be secured to your skin. This helps your provider remove the stent. You may be given a strainer to collect any stone pieces that you pass in your pee. Your provider may have these tested. This information is not intended to replace advice given to you by your health care provider. Make sure you discuss any questions you have with your health care provider. Document Revised: 07/04/2022 Document Reviewed: 07/04/2022 Elsevier Patient Education  2024 Elsevier Inc.  

## 2023-04-16 NOTE — Addendum Note (Signed)
Addended by: Sueanne Margarita on: 04/16/2023 03:24 PM   Modules accepted: Orders

## 2023-04-16 NOTE — Progress Notes (Addendum)
   [facility] Surgical Posting Form  Surgery Date: Date: 06/26/2023  Surgeon: Dr. Legrand Rams, MD  Inpt ( No  )   Outpt (Yes)   Obs ( No  )   Diagnosis: N20.0 Right Nephrolithiasis   -CPT: [phone]  Surgery: Right Ureteroscopy with Laser Lithotripsy and Stent Placement   Stop Anticoagulations: No and may continue ASA  Cardiac/Medical/Pulmonary Clearance needed: no  *Orders entered into EPIC  Date: 05/12/23   *Case booked in [place]  Date: 05/12/23  *Notified pt of Surgery: Date: 05/12/23  PRE-OP UA & CX: yes, obtained in clinic  *Placed into Prior Authorization Work Que Date: 05/12/23  Assistant/laser/rep:No

## 2023-04-16 NOTE — Progress Notes (Signed)
   04/16/2023 11:54 AM   Billy Thomas [birth year] [id number]  Reason for visit: Discuss CT results, nephrolithiasis, history of prostatitis  HPI: 57 year old male previously followed by Dr. Retta Diones in [place] who transferred his care to [place] in April 2024 based on location.  At that time he was having a few months of pelvic and perineal pain, dark urine, but gross hematuria, and groin pain.  He ultimately was diagnosed with prostatitis and treated with a 2-week course of Bactrim and Flomax, and symptoms resolved.  He had persistent microscopic hematuria and mild pyuria on follow-up urinalysis and opted for further evaluation with CT urogram and cystoscopy.  He still has some chronic left-sided low back, hip, and left lower extremity discomfort and pain, and has a appointment with sports medicine next week.  I personally viewed and interpreted the CT urogram 04/07/23. Large 1.3cm right renal pelvis stone with some mild right collecting system dilation, no left sided stones, no filling defects.  We reviewed options for this large stone including surveillance, ureteroscopy, or shockwave lithotripsy.  Sound like he has failed shockwave lithotripsy in the future and require to follow-up ureteroscopy, we also discussed the benefit of if he pursues ureteroscopy can perform cystoscopy at that time and defer cystoscopy that was originally scheduled for today.  He would like to avoid cystoscopy in clinic and is amenable to right ureteroscopy, laser lithotripsy, stent placement.  He also understands the potential for additional pathology found at the time of procedure that could require biopsy, urethral dilation, or other intervention.  We specifically discussed the risks ureteroscopy including bleeding, infection/sepsis, stent related symptoms including flank pain/urgency/frequency/incontinence/dysuria, ureteral injury, inability to access stone, or need for staged or additional procedures.  Continue  Flomax Schedule right ureteroscopy, laser lithotripsy, stent placement Keep follow up with sports medicine next week regarding his left low back/hip/left lower extremity pain that sounds MSK in nature   Sondra Come, MD  [facility] [address] [place], [place] [address] [phone]

## 2023-04-16 NOTE — Telephone Encounter (Signed)
I spoke with Mr. Billy Thomas and his wife. We have discussed possible surgery dates and Friday June 28th, 2024 was agreed upon by all parties. Patient given information about surgery date, what to expect pre-operatively and post operatively.  We discussed that a Pre-Admission Testing office will be calling to set up the pre-op visit that will take place prior to surgery, and that these appointments are typically done over the phone with a Pre-Admissions RN. Informed patient that our office will communicate any additional care to be provided after surgery. Patients questions or concerns were discussed during our call. Advised to call our office should there be any additional information, questions or concerns that arise. Patient verbalized understanding.

## 2023-04-17 LAB — URINALYSIS, COMPLETE
Bilirubin, UA: NEGATIVE
Glucose, UA: NEGATIVE
Leukocytes,UA: NEGATIVE
Nitrite, UA: NEGATIVE
Urobilinogen, Ur: 0.2 mg/dL (ref 0.2–1.0)
pH, UA: 6.5 (ref 5.0–7.5)

## 2023-04-17 LAB — MICROSCOPIC EXAMINATION

## 2023-04-21 LAB — CULTURE, URINE COMPREHENSIVE

## 2023-05-02 ENCOUNTER — Encounter (HOSPITAL_COMMUNITY): Payer: Self-pay | Admitting: Urgent Care

## 2023-05-05 NOTE — Progress Notes (Signed)
HPI male smoker followed for OSA, COPD, BPH, anxiety, GERD, obesity, Office Spirometry 07/23/17-moderate obstructive airways disease. FVC 3.42/63%, FEV1 2.71/64%, ratio 0.79, FEF 25-75% 2.62/71% HST 08/03/17-AHI 12.2/hour, desaturation to 86%, body weight 232 pounds -------------------------------------------------------------------------------   05/06/22-  57 year old male Smoker followed for OSA, COPD,complicated by  BPH, Anxiety, GERD, obesity, - Bevespi, Ventolin hfa,  CPAP auto 5-20/[employer] Download- compliance 100%, AHI 1.1/ hr Body weight today-216 lbs Covid vax-3 Moderna -----Follow up. Patient has no complaints.  He is very comfortable with his CPAP and does not sleep without it.  No concerns. Considers his breathing stable.  Still smokes which we discussed.  Using his Bevespi 1 puff twice daily, usually.  Rarely uses rescue inhaler at all.  05/07/23-  57 year old male Smoker followed for OSA, COPD,complicated by  BPH, Anxiety, GERD, obesity, - Bevespi, Ventolin hfa,  CPAP auto 5-20/[employer] Download- compliance 100%, AHI 1/hr Body weight 212 lbs Download reviewed.  He says CPAP is "great". Back pain being evaluated. Uses rescue inhaler about 3 days a week. CXR 05/06/22 IMPRESSION: Probable right basilar atelectasis. No acute cardiopulmonary process.  ROS-see HPI   + = positive Constitutional:    weight loss, night sweats, fevers, chills, fatigue, lassitude. HEENT:    headaches, difficulty swallowing, tooth/dental problems, sore throat,       sneezing, itching, ear ache, nasal congestion, post nasal drip, snoring CV:    chest pain, orthopnea, PND, swelling in lower extremities, anasarca,                                              dizziness, palpitations Resp:   + shortness of breath with exertion or at rest.                productive cough,   non-productive cough, coughing up of blood.              change in color of mucus.  wheezing.   Skin:    rash or lesions. GI:  No-    heartburn, indigestion, abdominal pain, nausea, vomiting, diarrhea,                 change in bowel habits, loss of appetite GU: dysuria, change in color of urine, no urgency or frequency.   flank pain. MS:   joint pain, stiffness, decreased range of motion, back pain. Neuro-     nothing unusual Psych:  change in mood or affect.  depression or +anxiety.   memory loss.  OBJ- Physical Exam General- Alert, Oriented, Affect-appropriate, Distress- none acute Skin- rash-none, lesions- none, excoriation- none Lymphadenopathy- none Head- atraumatic            Eyes- Gross vision intact, PERRLA, conjunctivae and secretions clear            Ears- Hearing, canals-normal            Nose- Clear, no-Septal dev, mucus, polyps, erosion, perforation             Throat- Mallampati IV , mucosa clear , drainage- none, tonsils- atrophic, Neck- flexible , trachea midline, no stridor , thyroid nl, carotid no bruit Chest - symmetrical excursion , unlabored           Heart/CV- RRR , no murmur , no gallop  , no rub, nl s1 s2                           -  JVD- none , edema- none, stasis changes- none, varices- none           Lung- , wheeze+/unlabored, cough-none, dullness-none, rub- none           Chest wall-  Abd-  Br/ Gen/ Rectal- Not done, not indicated Extrem- cyanosis- none, clubbing, none, atrophy- none, strength- nl Neuro- grossly intact to observation

## 2023-05-07 ENCOUNTER — Inpatient Hospital Stay
Admission: RE | Admit: 2023-05-07 | Discharge: 2023-05-07 | Disposition: A | Discharge status: Home / Self Care | Payer: 59 | Source: Ambulatory Visit

## 2023-05-07 ENCOUNTER — Encounter: Payer: Self-pay | Admitting: Internal Medicine

## 2023-05-07 ENCOUNTER — Ambulatory Visit (INDEPENDENT_AMBULATORY_CARE_PROVIDER_SITE_OTHER): Payer: 59

## 2023-05-07 ENCOUNTER — Ambulatory Visit (INDEPENDENT_AMBULATORY_CARE_PROVIDER_SITE_OTHER): Payer: 59 | Admitting: Internal Medicine

## 2023-05-07 VITALS — BP 108/62 | HR 75 | Temp 98.3°F | Ht 72.0 in | Wt 212.8 lb

## 2023-05-07 DIAGNOSIS — J449 Chronic obstructive pulmonary disease, unspecified: Secondary | ICD-10-CM

## 2023-05-07 DIAGNOSIS — Z720 Tobacco use: Secondary | ICD-10-CM | POA: Diagnosis not present

## 2023-05-07 DIAGNOSIS — G4733 Obstructive sleep apnea (adult) (pediatric): Secondary | ICD-10-CM | POA: Diagnosis not present

## 2023-05-07 MED ORDER — ALBUTEROL SULFATE HFA 108 (90 BASE) MCG/ACT IN AERS: 2.0000 | INHALATION_SPRAY | Freq: Four times a day (QID) | RESPIRATORY_TRACT | 12 refills | Status: DC | PRN

## 2023-05-07 MED ORDER — BEVESPI AEROSPHERE 9-4.8 MCG/ACT IN AERO: INHALATION_SPRAY | RESPIRATORY_TRACT | 3 refills | Status: AC

## 2023-05-07 NOTE — Patient Instructions (Signed)
You are doing great with CPAP- continue autopap 5-20  We refilled your inhalers  Order- CXR dx Tobacco user, COPD mixed type

## 2023-05-07 NOTE — Patient Instructions (Signed)
Your procedure is scheduled on: 05/15/23 - Friday Report to the Registration Desk on the 1st floor of the [facility]. To find out your arrival time, please call [phone] between 1PM - 3PM on: 05/14/23 - Thursday If your arrival time is 6:00 am, do not arrive before that time as the [facility] entrance doors do not open until 6:00 am.  REMEMBER: Instructions that are not followed completely may result in serious medical risk, up to and including death; or upon the discretion of your surgeon and anesthesiologist your surgery may need to be rescheduled.  Do not eat food or drink any liquids after midnight the night before surgery.  No gum chewing or hard candies.  One week prior to surgery: Stop Anti-inflammatories (NSAIDS) such as Advil, Aleve, Ibuprofen, Motrin, Naproxen, Naprosyn and Aspirin based products such as Excedrin, Goody's Powder, BC Powder.  Stop ANY OVER THE COUNTER supplements until after surgery.  You may however, continue to take Tylenol if needed for pain up until the day of surgery.  TAKE ONLY THESE MEDICATIONS THE MORNING OF SURGERY WITH A SIP OF WATER:  omeprazole (PRILOSEC)  (take one the night before and one on the morning of surgery - helps to prevent nausea after surgery.) BEVESPI AEROSPHERE)  tamsulosin (FLOMAX)   Use albuterol (VENTOLIN HFA)  on the day of surgery and bring to the hospital.  No Alcohol for 24 hours before or after surgery.  No Smoking including e-cigarettes for 24 hours before surgery.  No chewable tobacco products for at least 6 hours before surgery.  No nicotine patches on the day of surgery.  Do not use any "recreational" drugs for at least a week (preferably 2 weeks) before your surgery.  Please be advised that the combination of cocaine and anesthesia may have negative outcomes, up to and including death. If you test positive for cocaine, your surgery will be cancelled.  On the morning of surgery brush your teeth with  toothpaste and water, you may rinse your mouth with mouthwash if you wish. Do not swallow any toothpaste or mouthwash.  Use CHG Soap or wipes as directed on instruction sheet.  Do not wear jewelry, make-up, hairpins, clips or nail polish.  Do not wear lotions, powders, or perfumes.   Do not shave body hair from the neck down 48 hours before surgery.  Contact lenses, hearing aids and dentures may not be worn into surgery.  Do not bring valuables to the hospital. [facility] is not responsible for any missing/lost belongings or valuables.   Bring your C-PAP to the hospital in case you may have to spend the night.   Notify your doctor if there is any change in your medical condition (cold, fever, infection).  Wear comfortable clothing (specific to your surgery type) to the hospital.  After surgery, you can help prevent lung complications by doing breathing exercises.  Take deep breaths and cough every 1-2 hours. Your doctor may order a device called an Incentive Spirometer to help you take deep breaths. When coughing or sneezing, hold a pillow firmly against your incision with both hands. This is called "splinting." Doing this helps protect your incision. It also decreases belly discomfort.  If you are being admitted to the hospital overnight, leave your suitcase in the car. After surgery it may be brought to your room.  In case of increased patient census, it may be necessary for you, the patient, to continue your postoperative care in the Same Day Surgery department.  If  you are being discharged the day of surgery, you will not be allowed to drive home. You will need a responsible individual to drive you home and stay with you for 24 hours after surgery.   If you are taking public transportation, you will need to have a responsible individual with you.  Please call the Pre-admissions Testing Dept. at [phone] if you have any questions about these instructions.  Surgery  Visitation Policy:  Patients having surgery or a procedure may have two visitors.  Children under the age of 44 must have an adult with them who is not the patient.  Inpatient Visitation:    Visiting hours are 7 a.m. to 8 p.m. Up to four visitors are allowed at one time in a patient room. The visitors may rotate out with other people during the day.  One visitor age 32 or older may stay with the patient overnight and must be in the room by 8 p.m.

## 2023-05-15 ENCOUNTER — Encounter: Admission: RE | Payer: Self-pay | Source: Home / Self Care

## 2023-05-15 ENCOUNTER — Ambulatory Visit: Admission: RE | Admit: 2023-05-15 | Payer: 59 | Source: Home / Self Care | Admitting: Urology

## 2023-05-15 SURGERY — CYSTOSCOPY/URETEROSCOPY/HOLMIUM LASER/STENT PLACEMENT
Anesthesia: General | Laterality: Right

## 2023-05-18 NOTE — Progress Notes (Signed)
Labs are stable.  Review at next in office visit.

## 2023-06-12 ENCOUNTER — Encounter: Payer: Self-pay | Admitting: Internal Medicine

## 2023-06-12 NOTE — Assessment & Plan Note (Addendum)
Adequate control.  Bevespi helps.  Rescue inhaler used about 3 days a week. Plan-continue current meds, CXR

## 2023-06-12 NOTE — Assessment & Plan Note (Signed)
Benefits from CPAP with good compliance and control Plan- continue auto 5-20 

## 2023-06-12 NOTE — Assessment & Plan Note (Signed)
Continue emphasis on smoking cessation with support offered

## 2023-06-17 ENCOUNTER — Other Ambulatory Visit (INDEPENDENT_AMBULATORY_CARE_PROVIDER_SITE_OTHER): Payer: Self-pay | Admitting: Nurse Practitioner

## 2023-06-18 ENCOUNTER — Encounter
Admission: RE | Admit: 2023-06-18 | Discharge: 2023-06-18 | Disposition: A | Discharge status: Home / Self Care | Payer: 59 | Source: Ambulatory Visit | Attending: Urology | Admitting: Urology

## 2023-06-18 VITALS — Ht 72.0 in | Wt 215.0 lb

## 2023-06-18 DIAGNOSIS — Z01812 Encounter for preprocedural laboratory examination: Secondary | ICD-10-CM

## 2023-06-18 DIAGNOSIS — J449 Chronic obstructive pulmonary disease, unspecified: Secondary | ICD-10-CM

## 2023-06-18 HISTORY — DX: Benign prostatic hyperplasia without lower urinary tract symptoms: N40.0

## 2023-06-18 HISTORY — DX: Hyperlipidemia, unspecified: E78.5

## 2023-06-18 NOTE — Patient Instructions (Signed)
Your procedure is scheduled on: Friday, August 9 Report to the Registration Desk on the 1st floor of the [facility] To find out your arrival time, please call [phone] between 1PM - 3PM on: Thursday, August 8 If your arrival time is 6:00 am, do not arrive before that time as the [facility] entrance doors do not open until 6:00 am.  REMEMBER: Instructions that are not followed completely may result in serious medical risk, up to and including death; or upon the discretion of your surgeon and anesthesiologist your surgery may need to be rescheduled.  Do not eat or drink after midnight the night before surgery.  No gum chewing or hard candies.  One week prior to surgery: starting August 2 Stop Anti-inflammatories (NSAIDS) such as Advil, Aleve, Ibuprofen, Motrin, Naproxen, Naprosyn and Aspirin based products such as Excedrin, Goody's Powder, BC Powder. Stop ANY OVER THE COUNTER supplements until after surgery. Stop multiple vitamins. You may however, continue to take Tylenol if needed for pain up until the day of surgery.  Continue taking all prescribed medications   TAKE ONLY THESE MEDICATIONS THE MORNING OF SURGERY WITH A SIP OF WATER:  Bevespi inhaler Omeprazole (Prilosec) - (take one the night before and one on the morning of surgery - helps to prevent nausea after surgery.) Alprazolam (Xanax) if needed for anxiety  Use inhalers on the day of surgery and bring your albuterol inhaler to the hospital.  No Alcohol for 24 hours before or after surgery.  No Smoking including e-cigarettes for 24 hours before surgery.  No chewable tobacco products for at least 6 hours before surgery.  No nicotine patches on the day of surgery.  Do not use any "recreational" drugs for at least a week (preferably 2 weeks) before your surgery.  Please be advised that the combination of cocaine and anesthesia may have negative outcomes, up to and including death. If you test positive for cocaine,  your surgery will be cancelled.  On the morning of surgery brush your teeth with toothpaste and water, you may rinse your mouth with mouthwash if you wish. Do not swallow any toothpaste or mouthwash.  Do not wear jewelry, make-up, hairpins, clips or nail polish.  Do not wear lotions, powders, or perfumes.   Do not shave body hair from the neck down 48 hours before surgery.  Contact lenses, hearing aids and dentures may not be worn into surgery.  Do not bring valuables to the hospital. [facility] is not responsible for any missing/lost belongings or valuables.   Bring your C-PAP to the hospital in case you may have to spend the night.   Notify your doctor if there is any change in your medical condition (cold, fever, infection).  Wear comfortable clothing (specific to your surgery type) to the hospital.  After surgery, you can help prevent lung complications by doing breathing exercises.  Take deep breaths and cough every 1-2 hours.   If you are being discharged the day of surgery, you will not be allowed to drive home. You will need a responsible individual to drive you home and stay with you for 24 hours after surgery.   If you are taking public transportation, you will need to have a responsible individual with you.  Please call the Pre-admissions Testing Dept. at [phone] if you have any questions about these instructions.  Surgery Visitation Policy:  Patients having surgery or a procedure may have two visitors.  Children under the age of 25 must have an  adult with them who is not the patient.

## 2023-06-18 NOTE — Addendum Note (Signed)
Addended by: Letta Kocher A on: 06/18/2023 08:14 AM   Modules accepted: Orders

## 2023-06-23 ENCOUNTER — Encounter
Admission: RE | Admit: 2023-06-23 | Discharge: 2023-06-23 | Disposition: A | Discharge status: Home / Self Care | Payer: 59 | Source: Ambulatory Visit | Attending: Urology | Admitting: Urology

## 2023-06-23 DIAGNOSIS — Z01812 Encounter for preprocedural laboratory examination: Secondary | ICD-10-CM

## 2023-06-23 DIAGNOSIS — Z01810 Encounter for preprocedural cardiovascular examination: Secondary | ICD-10-CM | POA: Insufficient documentation

## 2023-06-23 DIAGNOSIS — J449 Chronic obstructive pulmonary disease, unspecified: Secondary | ICD-10-CM | POA: Diagnosis not present

## 2023-06-26 ENCOUNTER — Ambulatory Visit
Admission: RE | Admit: 2023-06-26 | Discharge: 2023-06-26 | Disposition: A | Discharge status: Home / Self Care | Payer: 59 | Source: Home / Self Care | Attending: Urology | Admitting: Urology

## 2023-06-26 ENCOUNTER — Encounter
Admission: RE | Disposition: A | Discharge status: Home / Self Care | Payer: Self-pay | Source: Home / Self Care | Attending: Urology

## 2023-06-26 ENCOUNTER — Ambulatory Visit: Payer: 59 | Admitting: Anesthesiology

## 2023-06-26 ENCOUNTER — Encounter: Payer: Self-pay | Admitting: Urology

## 2023-06-26 ENCOUNTER — Ambulatory Visit: Payer: 59

## 2023-06-26 ENCOUNTER — Other Ambulatory Visit: Payer: Self-pay

## 2023-06-26 DIAGNOSIS — N200 Calculus of kidney: Secondary | ICD-10-CM | POA: Insufficient documentation

## 2023-06-26 DIAGNOSIS — J449 Chronic obstructive pulmonary disease, unspecified: Secondary | ICD-10-CM | POA: Insufficient documentation

## 2023-06-26 DIAGNOSIS — G4730 Sleep apnea, unspecified: Secondary | ICD-10-CM | POA: Insufficient documentation

## 2023-06-26 DIAGNOSIS — F17210 Nicotine dependence, cigarettes, uncomplicated: Secondary | ICD-10-CM | POA: Insufficient documentation

## 2023-06-26 HISTORY — PX: CYSTOSCOPY/URETEROSCOPY/HOLMIUM LASER/STENT PLACEMENT: SHX6546

## 2023-06-26 SURGERY — CYSTOSCOPY/URETEROSCOPY/HOLMIUM LASER/STENT PLACEMENT
Anesthesia: General | Site: Ureter | Laterality: Right

## 2023-06-26 MED ORDER — FENTANYL CITRATE (PF) 100 MCG/2ML IJ SOLN
25.0000 ug | INTRAMUSCULAR | Status: DC | PRN
  Administered 2023-06-26 (×2): 25 ug via INTRAVENOUS

## 2023-06-26 MED ORDER — CHLORHEXIDINE GLUCONATE 0.12 % MT SOLN
15.0000 mL | Freq: Once | OROMUCOSAL | Status: AC
  Administered 2023-06-26: 15 mL via OROMUCOSAL

## 2023-06-26 MED ORDER — LACTATED RINGERS IV SOLN: INTRAVENOUS | Status: DC

## 2023-06-26 MED ORDER — PHENYLEPHRINE 80 MCG/ML (10ML) SYRINGE FOR IV PUSH (FOR BLOOD PRESSURE SUPPORT)
PREFILLED_SYRINGE | INTRAVENOUS | Status: AC
  Filled 2023-06-26: qty 10

## 2023-06-26 MED ORDER — DEXAMETHASONE SODIUM PHOSPHATE 10 MG/ML IJ SOLN
INTRAMUSCULAR | Status: AC
  Filled 2023-06-26: qty 1

## 2023-06-26 MED ORDER — LIDOCAINE HCL (PF) 2 % IJ SOLN
INTRAMUSCULAR | Status: AC
  Filled 2023-06-26: qty 5

## 2023-06-26 MED ORDER — CEFAZOLIN SODIUM-DEXTROSE 2-4 GM/100ML-% IV SOLN
2.0000 g | INTRAVENOUS | Status: AC
  Administered 2023-06-26: 2 g via INTRAVENOUS

## 2023-06-26 MED ORDER — LIDOCAINE HCL (CARDIAC) PF 100 MG/5ML IV SOSY
PREFILLED_SYRINGE | INTRAVENOUS | Status: DC | PRN
  Administered 2023-06-26: 60 mg via INTRAVENOUS

## 2023-06-26 MED ORDER — ONDANSETRON HCL 4 MG/2ML IJ SOLN
INTRAMUSCULAR | Status: AC
  Filled 2023-06-26: qty 2

## 2023-06-26 MED ORDER — OXYBUTYNIN CHLORIDE ER 10 MG PO TB24: 10.0000 mg | ORAL_TABLET | Freq: Every day | ORAL | 0 refills | Status: AC | PRN

## 2023-06-26 MED ORDER — OXYCODONE HCL 5 MG PO TABS
5.0000 mg | ORAL_TABLET | Freq: Once | ORAL | Status: AC | PRN
  Administered 2023-06-26: 5 mg via ORAL

## 2023-06-26 MED ORDER — OXYCODONE HCL 5 MG PO TABS
ORAL_TABLET | ORAL | Status: AC
  Filled 2023-06-26: qty 1

## 2023-06-26 MED ORDER — ROCURONIUM BROMIDE 100 MG/10ML IV SOLN
INTRAVENOUS | Status: DC | PRN
  Administered 2023-06-26: 40 mg via INTRAVENOUS
  Administered 2023-06-26: 20 mg via INTRAVENOUS

## 2023-06-26 MED ORDER — MIDAZOLAM HCL 2 MG/2ML IJ SOLN
INTRAMUSCULAR | Status: AC
  Filled 2023-06-26: qty 2

## 2023-06-26 MED ORDER — CEFAZOLIN SODIUM-DEXTROSE 2-4 GM/100ML-% IV SOLN
INTRAVENOUS | Status: AC
  Filled 2023-06-26: qty 100

## 2023-06-26 MED ORDER — KETOROLAC TROMETHAMINE 30 MG/ML IJ SOLN
INTRAMUSCULAR | Status: AC
  Filled 2023-06-26: qty 1

## 2023-06-26 MED ORDER — PROPOFOL 10 MG/ML IV BOLUS
INTRAVENOUS | Status: DC | PRN
Start: 2023-06-26 — End: 2023-06-26
  Administered 2023-06-26: 150 mg via INTRAVENOUS

## 2023-06-26 MED ORDER — SODIUM CHLORIDE 0.9 % IR SOLN
Status: DC | PRN
  Administered 2023-06-26: 3000 mL

## 2023-06-26 MED ORDER — SUGAMMADEX SODIUM 200 MG/2ML IV SOLN
INTRAVENOUS | Status: DC | PRN
  Administered 2023-06-26: 200 mg via INTRAVENOUS

## 2023-06-26 MED ORDER — ONDANSETRON HCL 4 MG/2ML IJ SOLN: 4.0000 mg | Freq: Once | INTRAMUSCULAR | Status: DC | PRN

## 2023-06-26 MED ORDER — FENTANYL CITRATE (PF) 100 MCG/2ML IJ SOLN
INTRAMUSCULAR | Status: AC
  Filled 2023-06-26: qty 2

## 2023-06-26 MED ORDER — IOHEXOL 180 MG/ML  SOLN
INTRAMUSCULAR | Status: DC | PRN
  Administered 2023-06-26: 4 mL

## 2023-06-26 MED ORDER — FENTANYL CITRATE (PF) 100 MCG/2ML IJ SOLN
INTRAMUSCULAR | Status: DC | PRN
  Administered 2023-06-26: 50 ug via INTRAVENOUS

## 2023-06-26 MED ORDER — MIDAZOLAM HCL 2 MG/2ML IJ SOLN
INTRAMUSCULAR | Status: DC | PRN
  Administered 2023-06-26: 2 mg via INTRAVENOUS

## 2023-06-26 MED ORDER — ACETAMINOPHEN 10 MG/ML IV SOLN: 1000.0000 mg | Freq: Once | INTRAVENOUS | Status: DC | PRN

## 2023-06-26 MED ORDER — KETOROLAC TROMETHAMINE 30 MG/ML IJ SOLN
INTRAMUSCULAR | Status: DC | PRN
  Administered 2023-06-26: 30 mg via INTRAVENOUS

## 2023-06-26 MED ORDER — ORAL CARE MOUTH RINSE: 15.0000 mL | Freq: Once | OROMUCOSAL | Status: AC

## 2023-06-26 MED ORDER — CHLORHEXIDINE GLUCONATE 0.12 % MT SOLN
OROMUCOSAL | Status: AC
  Filled 2023-06-26: qty 15

## 2023-06-26 MED ORDER — ONDANSETRON HCL 4 MG/2ML IJ SOLN
INTRAMUSCULAR | Status: DC | PRN
  Administered 2023-06-26: 4 mg via INTRAVENOUS

## 2023-06-26 MED ORDER — PROPOFOL 10 MG/ML IV BOLUS
INTRAVENOUS | Status: AC
  Filled 2023-06-26: qty 20

## 2023-06-26 MED ORDER — DEXAMETHASONE SODIUM PHOSPHATE 10 MG/ML IJ SOLN
INTRAMUSCULAR | Status: DC | PRN
  Administered 2023-06-26: 4 mg via INTRAVENOUS

## 2023-06-26 MED ORDER — ONDANSETRON HCL 4 MG PO TABS: 4.0000 mg | ORAL_TABLET | Freq: Three times a day (TID) | ORAL | 0 refills | Status: AC | PRN

## 2023-06-26 MED ORDER — OXYCODONE-ACETAMINOPHEN 5-325 MG PO TABS: 1.0000 | ORAL_TABLET | Freq: Four times a day (QID) | ORAL | 0 refills | Status: AC | PRN

## 2023-06-26 MED ORDER — OXYCODONE HCL 5 MG/5ML PO SOLN: 5.0000 mg | Freq: Once | ORAL | Status: AC | PRN

## 2023-06-26 SURGICAL SUPPLY — 31 items
ADH LQ OCL WTPRF AMP STRL LF (MISCELLANEOUS)
ADHESIVE MASTISOL STRL (MISCELLANEOUS) IMPLANT
BAG DRAIN SIEMENS DORNER NS (MISCELLANEOUS) ×2 IMPLANT
BAG DRN NS LF (MISCELLANEOUS) ×1
BAG PRESSURE INF REUSE 3000 (BAG) ×2 IMPLANT
BRUSH SCRUB EZ 1% IODOPHOR (MISCELLANEOUS) ×2 IMPLANT
CATH URET FLEX-TIP 2 LUMEN 10F (CATHETERS) IMPLANT
CATH URETL OPEN 5X70 (CATHETERS) IMPLANT
CNTNR URN SCR LID CUP LEK RST (MISCELLANEOUS) IMPLANT
CONT SPEC 4OZ STRL OR WHT (MISCELLANEOUS)
DRAPE UTILITY 15X26 TOWEL STRL (DRAPES) ×2 IMPLANT
DRSG TEGADERM 2-3/8X2-3/4 SM (GAUZE/BANDAGES/DRESSINGS) IMPLANT
FIBER LASER MOSES 200 DFL (Laser) IMPLANT
FIBER LASER MOSES 365 DFL (Laser) IMPLANT
GLOVE BIOGEL PI IND STRL 7.5 (GLOVE) ×2 IMPLANT
GOWN STRL REUS W/ TWL LRG LVL3 (GOWN DISPOSABLE) ×2 IMPLANT
GOWN STRL REUS W/ TWL XL LVL3 (GOWN DISPOSABLE) ×2 IMPLANT
GOWN STRL REUS W/TWL LRG LVL3 (GOWN DISPOSABLE) ×1
GOWN STRL REUS W/TWL XL LVL3 (GOWN DISPOSABLE) ×1
GUIDEWIRE STR DUAL SENSOR (WIRE) ×2 IMPLANT
IV NS IRRIG 3000ML ARTHROMATIC (IV SOLUTION) ×2 IMPLANT
KIT TURNOVER CYSTO (KITS) ×2 IMPLANT
PACK CYSTO AR (MISCELLANEOUS) ×2 IMPLANT
SET CYSTO W/LG BORE CLAMP LF (SET/KITS/TRAYS/PACK) ×2 IMPLANT
SHEATH NAVIGATOR HD 12/14X36 (SHEATH) IMPLANT
STENT URET 6FRX24 CONTOUR (STENTS) IMPLANT
STENT URET 6FRX26 CONTOUR (STENTS) IMPLANT
SURGILUBE 2OZ TUBE FLIPTOP (MISCELLANEOUS) ×2 IMPLANT
SYR 10ML LL (SYRINGE) ×2 IMPLANT
VALVE UROSEAL ADJ ENDO (VALVE) IMPLANT
WATER STERILE IRR 500ML POUR (IV SOLUTION) ×2 IMPLANT

## 2023-06-26 NOTE — H&P (Signed)
   06/26/23 1:19 PM   MICIAH LEICHTMAN 1966/09/18 [id number]  CC: Right renal stone  HPI: 57 year old male with history of prostatitis, persistent microscopic hematuria as well as some right-sided flank discomfort and CT showed a 1.3 cm right renal pelvis stone.  He opted for ureteroscopy.  He denies any fevers or chills.   PMH: Past Medical History:  Diagnosis Date   Anxiety    situational anxiety   Benign prostatic hyperplasia    COPD (chronic obstructive pulmonary disease) (HCC)    possible; pt states unconfirmed-PRN inhaler   GERD (gastroesophageal reflux disease)    OTC PRN meds-   Hyperlipidemia    Kidney stones 2023   Non-cardiac chest pain 03/2011   Sleep apnea     Surgical History: Past Surgical History:  Procedure Laterality Date   APPENDECTOMY  2005   BROW LIFT Bilateral 02/01/2021   Procedure: BLEPHAROPLASTY;  Surgeon: Allena Napoleon, MD;  Location: [facility];  Service: Plastics;  Laterality: Bilateral;   CARDIAC CATHETERIZATION  03/18/2011   COLONOSCOPY  10/29/2022   CYSTOSCOPY W/ RETROGRADES Left 08/28/2006   NASAL SEPTOPLASTY W/ TURBINOPLASTY  1995   RHINOPLASTY  1995   SHOULDER ARTHROSCOPY Right 11/22/2008   SHOULDER ARTHROSCOPY Left 01/28/2006   WISDOM TOOTH EXTRACTION  2003    Family History: Family History  Problem Relation Age of Onset   Colon polyps Mother 84   Esophageal cancer Father 18       [place]   Lung cancer Father 72       smoker- [place]   Heart attack Paternal Grandfather    Heart disease Paternal Grandfather    Colon cancer Neg Hx    Stomach cancer Neg Hx    Rectal cancer Neg Hx     Social History:  reports that he has been smoking cigarettes. He has a 64 pack-year smoking history. He has been exposed to tobacco smoke. He has never used smokeless tobacco. He reports that he does not drink alcohol and does not use drugs.  Physical Exam: BP 115/77   Pulse 70    Temp 98.5 F (36.9 C) (Temporal)   Resp 17   Ht 6' (1.829 m)   Wt 97.5 kg   SpO2 97%   BMI 29.16 kg/m    Constitutional:  Alert and oriented, No acute distress. Cardiovascular: Regular rate and rhythm Respiratory: Clear to auscultation bilaterally GI: Abdomen is soft, nontender, nondistended, no abdominal masses  Laboratory Data: Urine culture 04/16/2023 no growth  Assessment & Plan:   57 year old male with 1.3 centimeter right renal pelvis stone with intermittent symptoms, opted for ureteroscopy.  We specifically discussed the risks ureteroscopy including bleeding, infection/sepsis, stent related symptoms including flank pain/urgency/frequency/incontinence/dysuria, ureteral injury, inability to access stone, or need for staged or additional procedures.  Right ureteroscopy, laser lithotripsy, stent placement  Legrand Rams, MD 06/26/2023  [facility] [address] [place], [place] [address] [phone]

## 2023-06-26 NOTE — Op Note (Signed)
Date of procedure: 06/26/23  Preoperative diagnosis:  Right renal stone  Postoperative diagnosis:  Same  Procedure: Cystoscopy, right ureteroscopy, laser lithotripsy, right retrograde pyelogram with intraoperative interpretation, right ureteral stent placement  Surgeon: Legrand Rams, MD  Anesthesia: General  Complications: None  Intraoperative findings:  Small prostate, normal-appearing bladder, uncomplicated dusting of 1.3 cm right renal pelvis stone  Uncomplicated stent placement  EBL: None  Specimens: None  Drains: Right 6 French by 26 cm ureteral stent  Indication: IZZIAH CAPPER is a 57 y.o. patient with 1.3 cm right renal pelvis stone with intermittent flank pain who opted for ureteroscopy.  After reviewing the management options for treatment, they elected to proceed with the above surgical procedure(s). We have discussed the potential benefits and risks of the procedure, side effects of the proposed treatment, the likelihood of the patient achieving the goals of the procedure, and any potential problems that might occur during the procedure or recuperation. Informed consent has been obtained.  Description of procedure:  The patient was taken to the operating room and general anesthesia was induced. SCDs were placed for DVT prophylaxis. The patient was placed in the dorsal lithotomy position, prepped and draped in the usual sterile fashion, and preoperative antibiotics were administered. A preoperative time-out was performed.   A 21 French rigid cystoscope was used to intubate the urethra and a normal-appearing urethra was followed proximally to the bladder.  The prostate was small.  Thorough cystoscopy showed no abnormal findings, and ureteral orifices were orthotopic bilaterally.  A sensor wire passed easily into the right ureteral orifice and up to the kidney under fluoroscopic vision.  A dual-lumen ureteral access catheter was advanced over the wire to the proximal  ureter, and a second safety sensor wire was added.  A 12/14 French by 36 cm ureteral access sheath was then advanced gently over the wire to the proximal ureter.  A digital single-channel flexible ureteroscope was advanced through the sheath and thorough pyeloscopy was performed.  There was a 1.3 cm yellow calcium oxalate appearing stone in the renal pelvis.  A 365 m laser fiber on settings of 0.3 J and 80 Hz was used to methodically dust the stone.  Thorough pyeloscopy revealed no fragments larger than the laser fiber.  A retrograde pyelogram performed from the proximal ureter and showed no extravasation or filling defects.  The sheath was removed, and careful pullback ureteroscopy showed no stone fragments or ureteral injury.  The rigid cystoscope was backloaded over the wire and a 6 [place] by 26 cm ureteral stent was uneventfully placed with a curl in the upper pole, as well as under direct vision in the bladder.  Fluid drained through the side ports of the stent.  The bladder was drained and this concluded our procedure.  Disposition: Stable to PACU  Plan: Stent removal in clinic next Thursday  Legrand Rams, MD

## 2023-06-26 NOTE — Anesthesia Postprocedure Evaluation (Signed)
Anesthesia Post Note  Patient: Scot Jun  Procedure(s) Performed: CYSTOSCOPY/URETEROSCOPY/HOLMIUM LASER/STENT PLACEMENT (Right: Ureter)  Patient location during evaluation: PACU Anesthesia Type: General Level of consciousness: awake and alert, oriented and patient cooperative Pain management: pain level controlled Vital Signs Assessment: post-procedure vital signs reviewed and stable Respiratory status: spontaneous breathing, nonlabored ventilation and respiratory function stable Cardiovascular status: blood pressure returned to baseline and stable Postop Assessment: adequate PO intake Anesthetic complications: no   No notable events documented.   Last Vitals:  Vitals:   06/26/23 1445 06/26/23 1450  BP: 120/75   Pulse: 60 62  Resp: 15 16  Temp:    SpO2: 97% 98%    Last Pain:  Vitals:   06/26/23 1450  TempSrc:   PainSc: 4                  Reed Breech

## 2023-06-26 NOTE — Discharge Instructions (Signed)

## 2023-06-26 NOTE — Anesthesia Procedure Notes (Signed)
Procedure Name: Intubation Date/Time: 06/26/2023 1:39 PM  Performed by: Nelle Don, CRNAPre-anesthesia Checklist: Patient identified, Emergency Drugs available, Suction available and Patient being monitored Patient Re-evaluated:Patient Re-evaluated prior to induction Oxygen Delivery Method: Circle system utilized Preoxygenation: Pre-oxygenation with 100% oxygen Induction Type: IV induction Ventilation: Two handed mask ventilation required and Oral airway inserted - appropriate to patient size Laryngoscope Size: McGraph and 4 Grade View: Grade I Tube type: Oral Tube size: 7.5 mm Number of attempts: 1 Airway Equipment and Method: Stylet and Video-laryngoscopy Placement Confirmation: ETT inserted through vocal cords under direct vision, positive ETCO2 and breath sounds checked- equal and bilateral Secured at: 23 cm Tube secured with: Tape Dental Injury: Teeth and Oropharynx as per pre-operative assessment  Comments: Elective mcgrath

## 2023-06-26 NOTE — Transfer of Care (Signed)
Immediate Anesthesia Transfer of Care Note  Patient: Billy Thomas  Procedure(s) Performed: CYSTOSCOPY/URETEROSCOPY/HOLMIUM LASER/STENT PLACEMENT (Right: Ureter)  Patient Location: PACU  Anesthesia Type:General  Level of Consciousness: awake, alert , and oriented  Airway & Oxygen Therapy: Patient Spontanous Breathing and Patient connected to face mask oxygen  Post-op Assessment: Report given to RN, Post -op Vital signs reviewed and stable, and Patient moving all extremities X 4  Post vital signs: Reviewed and stable  Last Vitals:  Vitals Value Taken Time  BP 120/73 06/26/23 1416  Temp    Pulse 71 06/26/23 1417  Resp 18 06/26/23 1417  SpO2 99 % 06/26/23 1417  Vitals shown include unfiled device data.  Last Pain:  Vitals:   06/26/23 1220  TempSrc: Temporal  PainSc: 3          Complications: No notable events documented.

## 2023-06-26 NOTE — Anesthesia Preprocedure Evaluation (Addendum)
Anesthesia Evaluation  Patient identified by MRN, date of birth, ID band Patient awake    Reviewed: Allergy & Precautions, NPO status , Patient's Chart, lab work & pertinent test results  History of Anesthesia Complications Negative for: history of anesthetic complications  Airway Mallampati: I   Neck ROM: Full    Dental  (+) Chipped   Pulmonary sleep apnea and Continuous Positive Airway Pressure Ventilation , COPD, Current Smoker (1 ppd)Patient did not abstain from smoking.   Pulmonary exam normal breath sounds clear to auscultation       Cardiovascular Exercise Tolerance: Good Normal cardiovascular exam Rhythm:Regular Rate:Normal  ECG 06/23/23: normal   Neuro/Psych  PSYCHIATRIC DISORDERS (panic disorder) Anxiety     negative neurological ROS     GI/Hepatic ,GERD  ,,  Endo/Other  negative endocrine ROS    Renal/GU      Musculoskeletal   Abdominal   Peds  Hematology negative hematology ROS (+)   Anesthesia Other Findings   Reproductive/Obstetrics                             Anesthesia Physical Anesthesia Plan  ASA: 2  Anesthesia Plan: General   Post-op Pain Management:    Induction: Intravenous  PONV Risk Score and Plan: 1 and Ondansetron, Dexamethasone and Treatment may vary due to age or medical condition  Airway Management Planned: Oral ETT  Additional Equipment:   Intra-op Plan:   Post-operative Plan: Extubation in OR  Informed Consent: I have reviewed the patients History and Physical, chart, labs and discussed the procedure including the risks, benefits and alternatives for the proposed anesthesia with the patient or authorized representative who has indicated his/her understanding and acceptance.     Dental advisory given  Plan Discussed with: CRNA  Anesthesia Plan Comments: (Patient consented for risks of anesthesia including but not limited to:  - adverse  reactions to medications - damage to eyes, teeth, lips or other oral mucosa - nerve damage due to positioning  - sore throat or hoarseness - damage to heart, brain, nerves, lungs, other parts of body or loss of life  Informed patient about role of CRNA in peri- and intra-operative care.  Patient voiced understanding.)        Anesthesia Quick Evaluation

## 2023-06-27 ENCOUNTER — Encounter: Payer: Self-pay | Admitting: Urology

## 2023-07-02 ENCOUNTER — Ambulatory Visit (INDEPENDENT_AMBULATORY_CARE_PROVIDER_SITE_OTHER): Payer: 59 | Admitting: Urology

## 2023-07-02 VITALS — BP 132/73 | HR 78

## 2023-07-02 DIAGNOSIS — N410 Acute prostatitis: Secondary | ICD-10-CM

## 2023-07-02 DIAGNOSIS — Z87442 Personal history of urinary calculi: Secondary | ICD-10-CM

## 2023-07-02 DIAGNOSIS — Z466 Encounter for fitting and adjustment of urinary device: Secondary | ICD-10-CM | POA: Diagnosis not present

## 2023-07-02 DIAGNOSIS — R399 Unspecified symptoms and signs involving the genitourinary system: Secondary | ICD-10-CM

## 2023-07-02 DIAGNOSIS — N200 Calculus of kidney: Secondary | ICD-10-CM

## 2023-07-02 MED ORDER — SULFAMETHOXAZOLE-TRIMETHOPRIM 800-160 MG PO TABS
1.0000 | ORAL_TABLET | Freq: Once | ORAL | Status: AC
Start: 2023-07-02 — End: 2023-07-02
  Administered 2023-07-02: 1 via ORAL

## 2023-07-02 MED ORDER — SULFAMETHOXAZOLE-TRIMETHOPRIM 800-160 MG PO TABS: 1.0000 | ORAL_TABLET | Freq: Two times a day (BID) | ORAL | 0 refills | Status: AC

## 2023-07-02 NOTE — Progress Notes (Signed)
Cystoscopy Procedure Note:  Indication: Stent removal s/p 06/26/2023 right ureteroscopy, laser lithotripsy for 1.3 cm right renal pelvis stone  Bactrim given for prophylaxis  After informed consent and discussion of the procedure and its risks, Billy Thomas was positioned and prepped in the standard fashion. Cystoscopy was performed with a flexible cystoscope. The stent was grasped with flexible graspers and removed in its entirety.   Findings: Uncomplicated stent removal  Assessment and Plan: -3 days Bactrim DS twice daily, he has some symptoms similar to prior prostatitis, unclear if this is infectious or stent related, urinalysis today pending, will send for culture.  If culture positive will extend antibiotics to 10 to 14-day course -Stone prevention strategies discussed -RTC 6 months KUB prior    Sondra Come, MD 07/02/2023

## 2023-07-05 LAB — CULTURE, URINE COMPREHENSIVE

## 2023-10-22 ENCOUNTER — Other Ambulatory Visit: Payer: Self-pay | Admitting: Family Medicine

## 2023-10-22 ENCOUNTER — Other Ambulatory Visit: Payer: Self-pay | Admitting: Nurse Practitioner

## 2023-10-22 DIAGNOSIS — F410 Panic disorder [episodic paroxysmal anxiety] without agoraphobia: Secondary | ICD-10-CM

## 2023-10-22 MED ORDER — ALPRAZOLAM 0.25 MG PO TABS: 0.2500 mg | ORAL_TABLET | Freq: Every day | ORAL | 2 refills | Status: DC | PRN

## 2023-10-22 NOTE — Telephone Encounter (Signed)
Refill sent in

## 2023-10-22 NOTE — Telephone Encounter (Signed)
This one too.  Herbert Seta

## 2023-11-23 ENCOUNTER — Ambulatory Visit
Admission: RE | Admit: 2023-11-23 | Discharge: 2023-11-23 | Disposition: A | Discharge status: Home / Self Care | Payer: 59 | Source: Ambulatory Visit | Attending: Family Medicine | Admitting: Family Medicine

## 2023-11-23 VITALS — BP 125/76 | HR 71 | Temp 98.0°F | Resp 18 | Ht 72.0 in | Wt 220.0 lb

## 2023-11-23 DIAGNOSIS — J0140 Acute pansinusitis, unspecified: Secondary | ICD-10-CM | POA: Diagnosis not present

## 2023-11-23 DIAGNOSIS — J441 Chronic obstructive pulmonary disease with (acute) exacerbation: Secondary | ICD-10-CM

## 2023-11-23 DIAGNOSIS — J22 Unspecified acute lower respiratory infection: Secondary | ICD-10-CM

## 2023-11-23 MED ORDER — PREDNISONE 20 MG PO TABS: 40.0000 mg | ORAL_TABLET | Freq: Every day | ORAL | 0 refills | Status: AC

## 2023-11-23 MED ORDER — AMOXICILLIN-POT CLAVULANATE 875-125 MG PO TABS: 1.0000 | ORAL_TABLET | Freq: Two times a day (BID) | ORAL | 0 refills | Status: AC

## 2023-11-23 MED ORDER — PROMETHAZINE-DM 6.25-15 MG/5ML PO SYRP: 5.0000 mL | ORAL_SOLUTION | Freq: Four times a day (QID) | ORAL | 0 refills | Status: AC | PRN

## 2023-11-23 NOTE — ED Triage Notes (Signed)
 Patient presents with cough and congestion x day 6. Treated with his wife antibiotics for 1 day and Mucinex.

## 2023-11-23 NOTE — ED Provider Notes (Signed)
 [facility]    CSN: [id number] Arrival date & time: 11/23/23  1544      History   Chief Complaint Chief Complaint  Patient presents with   Nasal Congestion    Also cough - Entered by patient    HPI Billy Thomas is a 58 y.o. male.   HPI Patient with a history of COPD presents today with a > 1 week history of initially nasal congestion and drainage followed by a mild cough.  Patient reports over the course of the last several days has become productive, he has had shortness of breath and wheezing.  Patient has COPD but reports that he rarely ever has to use any of his inhalers however today he noticed chest tightness and increased work of breathing and used his rescue inhaler twice.   Past Medical History:  Diagnosis Date   Anxiety    situational anxiety   Benign prostatic hyperplasia    COPD (chronic obstructive pulmonary disease) (HCC)    possible; pt states unconfirmed-PRN inhaler   GERD (gastroesophageal reflux disease)    OTC PRN meds-   Hyperlipidemia    Kidney stones 2023   Non-cardiac chest pain 03/2011   Sleep apnea     Patient Active Problem List   Diagnosis Date Noted   Other fatigue 04/14/2023   Urinary tract infection with hematuria 05/02/2022   Dyslipidemia, goal LDL below 100 04/09/2022   Impaired fasting glucose 04/09/2022   Tobacco use disorder, continuous 02/16/2022   Body mass index (BMI) of 30.0-30.9 in adult 02/16/2022   Acute non-recurrent pansinusitis 02/16/2022   Elevated glucose 09/21/2018   History of kidney stones 09/07/2018   Healthcare maintenance 07/21/2017   Candidiasis of mouth 05/05/2016   Edema tongue 05/05/2016   Benign prostatic hyperplasia 04/23/2016   GERD (gastroesophageal reflux disease) 04/23/2016   OSA on CPAP 04/23/2016   Tobacco abuse 04/23/2016   Tobacco abuse counseling 04/23/2016   Obesity 04/23/2016   COPD mixed type (HCC) 04/23/2016   Alcoholism in remission (HCC) 04/23/2016   GAD (generalized  anxiety disorder) 04/22/2016   Panic attack 04/22/2016   Alcoholism (HCC) 05/05/1981    Past Surgical History:  Procedure Laterality Date   APPENDECTOMY  2005   BROW LIFT Bilateral 02/01/2021   Procedure: BLEPHAROPLASTY;  Surgeon: Elisabeth Craig RAMAN, MD;  Location: [facility];  Service: Plastics;  Laterality: Bilateral;   CARDIAC CATHETERIZATION  03/18/2011   COLONOSCOPY  10/29/2022   CYSTOSCOPY W/ RETROGRADES Left 08/28/2006   CYSTOSCOPY/URETEROSCOPY/HOLMIUM LASER/STENT PLACEMENT Right 06/26/2023   Procedure: CYSTOSCOPY/URETEROSCOPY/HOLMIUM LASER/STENT PLACEMENT;  Surgeon: Francisca Redell BROCKS, MD;  Location: [facility];  Service: Urology;  Laterality: Right;   NASAL SEPTOPLASTY W/ TURBINOPLASTY  1995   RHINOPLASTY  1995   SHOULDER ARTHROSCOPY Right 11/22/2008   SHOULDER ARTHROSCOPY Left 01/28/2006   WISDOM TOOTH EXTRACTION  2003       Home Medications    Prior to Admission medications   Medication Sig Start Date End Date Taking? Authorizing Provider  amoxicillin -clavulanate (AUGMENTIN ) 875-125 MG tablet Take 1 tablet by mouth every 12 (twelve) hours for 10 days. 11/23/23 12/03/23 Yes Arloa Suzen RAMAN, NP  predniSONE  (DELTASONE ) 20 MG tablet Take 2 tablets (40 mg total) by mouth daily with breakfast. 11/23/23  Yes Arloa Suzen RAMAN, NP  promethazine -dextromethorphan (PROMETHAZINE -DM) 6.25-15 MG/5ML syrup Take 5 mLs by mouth 4 (four) times daily as needed for cough. 11/23/23  Yes Arloa Suzen RAMAN, NP  albuterol  (VENTOLIN  HFA) 108 (90 Base) MCG/ACT inhaler Inhale  2 puffs into the lungs every 6 (six) hours as needed for wheezing or shortness of breath. 05/07/23   Neysa Rama D, MD  ALPRAZolam  (XANAX ) 0.25 MG tablet Take 1 tablet (0.25 mg total) by mouth daily as needed. 10/22/23   Chandra Toribio POUR, MD  Glycopyrrolate -Formoterol  (BEVESPI  AEROSPHERE) 9-4.8 MCG/ACT AERO USE 2 INHALATIONS BY MOUTH TWICE DAILY Patient taking differently: Inhale 2 puffs into the lungs as needed. USE 2  INHALATIONS BY MOUTH TWICE DAILY 05/07/23   Neysa Rama D, MD  Multiple Vitamin (MULTIVITAMIN) capsule Take 1 capsule by mouth at bedtime.    [provider]  omeprazole  (PRILOSEC) 20 MG capsule Take 20 mg by mouth daily.    [provider]  ondansetron  (ZOFRAN ) 4 MG tablet Take 1 tablet (4 mg total) by mouth every 8 (eight) hours as needed for nausea or vomiting. 06/26/23   Francisca Redell BROCKS, MD  sulfamethoxazole -trimethoprim  (BACTRIM  DS) 800-160 MG tablet Take 1 tablet by mouth 2 (two) times daily. 07/02/23   Francisca Redell BROCKS, MD  tamsulosin  (FLOMAX ) 0.4 MG CAPS capsule Take 1 capsule (0.4 mg total) by mouth daily. Patient taking differently: Take 0.4 mg by mouth at bedtime. 02/18/23   Francisca Redell BROCKS, MD    Family History Family History  Problem Relation Age of Onset   Colon polyps Mother 20   Esophageal cancer Father 36       smoker[place]   Lung cancer Father 42       smoker- also [place]   Heart attack Paternal Grandfather    Heart disease Paternal Grandfather    Colon cancer Neg Hx    Stomach cancer Neg Hx    Rectal cancer Neg Hx     Social History Social History   Tobacco Use   Smoking status: Every Day    Current packs/day: 2.00    Average packs/day: 2.0 packs/day for 32.0 years (64.0 ttl pk-yrs)    Types: Cigarettes    Passive exposure: Current   Smokeless tobacco: Never  Vaping Use   Vaping status: Never Used  Substance Use Topics   Alcohol use: No   Drug use: No     Allergies   Hydrocodone    Review of Systems Review of Systems Pertinent negatives listed in HPI   Physical Exam Triage Vital Signs ED Triage Vitals  Encounter Vitals Group     BP 11/23/23 1610 125/76     Systolic BP Percentile --      Diastolic BP Percentile --      Pulse Rate 11/23/23 1610 71     Resp 11/23/23 1610 18     Temp 11/23/23 1610 98 F (36.7 C)     Temp Source 11/23/23 1610 Oral     SpO2 11/23/23 1612 97 %     Weight  11/23/23 1611 220 lb (99.8 kg)     Height 11/23/23 1611 6' (1.829 m)     Head Circumference --      Peak Flow --      Pain Score 11/23/23 1611 0     Pain Loc --      Pain Education --      Exclude from Growth Chart --    No data found.  Updated Vital Signs BP 125/76 (BP Location: Left Arm)   Pulse 71   Temp 98 F (36.7 C) (Oral)   Resp 18   Ht 6' (1.829 m)   Wt 220 lb (  99.8 kg)   SpO2 97%   BMI 29.84 kg/m   Visual Acuity Right Eye Distance:   Left Eye Distance:   Bilateral Distance:    Right Eye Near:   Left Eye Near:    Bilateral Near:     Physical Exam General appearance: alert, Ill-appearing, no distress Head: Normocephalic, without obvious abnormality, atraumatic ENT: mucosal edema, congestion, erythematous oropharynx w/o exudate Respiratory: Respirations even , unlabored, coarse lung sound, wheeze present Heart: Rate and rhythm normal. No gallop or murmurs noted on exam  Extremities: No gross deformities Skin: Skin color, texture, turgor normal. No rashes seen  Psych: Appropriate mood and affect. Neurologic: Mental status: Alert, oriented to person, place, and time, thought content appropriate.   UC Treatments / Results  Labs (all labs ordered are listed, but only abnormal results are displayed) Labs Reviewed - No data to display  EKG   Radiology No results found.  Procedures Procedures (including critical care time)  Medications Ordered in UC Medications - No data to display  Initial Impression / Assessment and Plan / UC Course  I have reviewed the triage vital signs and the nursing notes.  Pertinent labs & imaging results that were available during my care of the patient were reviewed by me and considered in my medical decision making (see chart for details).    Acute lower respiratory tract infection COPD exacerbation and acute nonrecurrent pansinusitis.  Empiric treatment for lower respiratory tract infection and COPD exacerbation with  Augmentin  twice daily for 10 days.  For wheezing and shortness of breath prednisone  40 mg daily for 5 days. Promethazine  DM as needed as needed for cough and congestion.  Patient encouraged to continue use of his rescue inhaler as needed. Final Clinical Impressions(s) / UC Diagnoses   Final diagnoses:  Acute lower respiratory tract infection  Acute non-recurrent pansinusitis  COPD exacerbation [facility])   Discharge Instructions   None    ED Prescriptions     Medication Sig Dispense Auth. Provider   predniSONE  (DELTASONE ) 20 MG tablet Take 2 tablets (40 mg total) by mouth daily with breakfast. 10 tablet Arloa Suzen RAMAN, NP   amoxicillin -clavulanate (AUGMENTIN ) 875-125 MG tablet Take 1 tablet by mouth every 12 (twelve) hours for 10 days. 20 tablet Arloa Suzen RAMAN, NP   promethazine -dextromethorphan (PROMETHAZINE -DM) 6.25-15 MG/5ML syrup Take 5 mLs by mouth 4 (four) times daily as needed for cough. 180 mL Arloa Suzen RAMAN, NP      PDMP not reviewed this encounter.   Arloa Suzen RAMAN, NP 11/24/23 [phone]

## 2024-01-07 ENCOUNTER — Ambulatory Visit: Payer: Self-pay | Admitting: Urology

## 2024-02-23 ENCOUNTER — Other Ambulatory Visit: Payer: Self-pay | Admitting: Urology

## 2024-03-28 ENCOUNTER — Telehealth: Payer: Self-pay

## 2024-03-28 ENCOUNTER — Other Ambulatory Visit: Payer: Self-pay | Admitting: Family Medicine

## 2024-03-28 MED ORDER — TAMSULOSIN HCL 0.4 MG PO CAPS: 0.4000 mg | ORAL_CAPSULE | Freq: Every day | ORAL | 1 refills | Status: DC

## 2024-03-28 NOTE — Telephone Encounter (Signed)
 Sending in the refill

## 2024-03-28 NOTE — Telephone Encounter (Signed)
 Pt stopped by to ask if provider is willing to take over tamsulosin  (FLOMAX ) 0.4 MG CAPS capsule   Pt was getting it through PCP then had kidney stones and [facility] took over the Rx pt canceled the 64mo fu with [facility] bc he wasn't having any issues and they wouldn't refill the rx.  Pharmacy:  [facility] - [place], [place] - [address]

## 2024-04-05 ENCOUNTER — Encounter (INDEPENDENT_AMBULATORY_CARE_PROVIDER_SITE_OTHER): Payer: Self-pay

## 2024-04-06 ENCOUNTER — Other Ambulatory Visit: Payer: 59

## 2024-04-13 ENCOUNTER — Encounter: Payer: 59 | Admitting: Family Medicine

## 2024-04-15 ENCOUNTER — Other Ambulatory Visit: Payer: Self-pay | Admitting: *Deleted

## 2024-04-15 DIAGNOSIS — E785 Hyperlipidemia, unspecified: Secondary | ICD-10-CM

## 2024-04-15 DIAGNOSIS — G4733 Obstructive sleep apnea (adult) (pediatric): Secondary | ICD-10-CM

## 2024-04-15 DIAGNOSIS — J449 Chronic obstructive pulmonary disease, unspecified: Secondary | ICD-10-CM

## 2024-04-15 DIAGNOSIS — R7301 Impaired fasting glucose: Secondary | ICD-10-CM

## 2024-04-15 DIAGNOSIS — E669 Obesity, unspecified: Secondary | ICD-10-CM

## 2024-04-15 DIAGNOSIS — Z720 Tobacco use: Secondary | ICD-10-CM

## 2024-04-18 ENCOUNTER — Other Ambulatory Visit

## 2024-04-18 DIAGNOSIS — E669 Obesity, unspecified: Secondary | ICD-10-CM

## 2024-04-18 DIAGNOSIS — Z720 Tobacco use: Secondary | ICD-10-CM

## 2024-04-18 DIAGNOSIS — E785 Hyperlipidemia, unspecified: Secondary | ICD-10-CM

## 2024-04-19 ENCOUNTER — Ambulatory Visit: Payer: Self-pay | Admitting: Family Medicine

## 2024-04-19 LAB — COMPREHENSIVE METABOLIC PANEL WITH GFR
ALT: 20 IU/L (ref 0–44)
AST: 18 IU/L (ref 0–40)
Albumin: 4.6 g/dL (ref 3.8–4.9)
Alkaline Phosphatase: 83 IU/L (ref 44–121)
BUN/Creatinine Ratio: 18 (ref 9–20)
BUN: 17 mg/dL (ref 6–24)
Bilirubin Total: 0.3 mg/dL (ref 0.0–1.2)
CO2: 21 mmol/L (ref 20–29)
Calcium: 9 mg/dL (ref 8.7–10.2)
Chloride: 102 mmol/L (ref 96–106)
Creatinine, Ser: 0.93 mg/dL (ref 0.76–1.27)
Globulin, Total: 1.9 g/dL (ref 1.5–4.5)
Glucose: 118 mg/dL — ABNORMAL HIGH (ref 70–99)
Potassium: 4.3 mmol/L (ref 3.5–5.2)
Sodium: 139 mmol/L (ref 134–144)
Total Protein: 6.5 g/dL (ref 6.0–8.5)
eGFR: 96 mL/min/{1.73_m2} (ref >59)

## 2024-04-19 LAB — LIPID PANEL
Chol/HDL Ratio: 3.9 ratio (ref 0.0–5.0)
Cholesterol, Total: 198 mg/dL (ref 100–199)
HDL: 51 mg/dL (ref >39)
LDL Chol Calc (NIH): 134 mg/dL — ABNORMAL HIGH (ref 0–99)
Triglycerides: 70 mg/dL (ref 0–149)
VLDL Cholesterol Cal: 13 mg/dL (ref 5–40)

## 2024-04-19 LAB — CBC WITH DIFFERENTIAL/PLATELET
Basophils Absolute: 0 10*3/uL (ref 0.0–0.2)
Basos: 1 %
EOS (ABSOLUTE): 0.1 10*3/uL (ref 0.0–0.4)
Eos: 1 %
Hematocrit: 41.2 % (ref 37.5–51.0)
Hemoglobin: 14 g/dL (ref 13.0–17.7)
Immature Grans (Abs): 0 10*3/uL (ref 0.0–0.1)
Immature Granulocytes: 0 %
Lymphocytes Absolute: 1.7 10*3/uL (ref 0.7–3.1)
Lymphs: 40 %
MCH: 33 pg (ref 26.6–33.0)
MCHC: 34 g/dL (ref 31.5–35.7)
MCV: 97 fL (ref 79–97)
Monocytes Absolute: 0.4 10*3/uL (ref 0.1–0.9)
Monocytes: 8 %
Neutrophils Absolute: 2.1 10*3/uL (ref 1.4–7.0)
Neutrophils: 50 %
Platelets: 210 10*3/uL (ref 150–450)
RBC: 4.24 x10E6/uL (ref 4.14–5.80)
RDW: 12.8 % (ref 11.6–15.4)
WBC: 4.3 10*3/uL (ref 3.4–10.8)

## 2024-04-19 LAB — TSH: TSH: 1.36 u[IU]/mL (ref 0.450–4.500)

## 2024-04-19 LAB — HEMOGLOBIN A1C
Est. average glucose Bld gHb Est-mCnc: 131 mg/dL
Hgb A1c MFr Bld: 6.2 % — ABNORMAL HIGH (ref 4.8–5.6)

## 2024-04-20 ENCOUNTER — Encounter: Payer: Self-pay | Admitting: Family Medicine

## 2024-04-20 ENCOUNTER — Ambulatory Visit (INDEPENDENT_AMBULATORY_CARE_PROVIDER_SITE_OTHER): Admitting: Family Medicine

## 2024-04-20 VITALS — BP 92/57 | HR 72 | Ht 72.0 in | Wt 218.5 lb

## 2024-04-20 DIAGNOSIS — F17209 Nicotine dependence, unspecified, with unspecified nicotine-induced disorders: Secondary | ICD-10-CM

## 2024-04-20 DIAGNOSIS — R7303 Prediabetes: Secondary | ICD-10-CM | POA: Insufficient documentation

## 2024-04-20 DIAGNOSIS — E785 Hyperlipidemia, unspecified: Secondary | ICD-10-CM

## 2024-04-20 DIAGNOSIS — F411 Generalized anxiety disorder: Secondary | ICD-10-CM

## 2024-04-20 DIAGNOSIS — F410 Panic disorder [episodic paroxysmal anxiety] without agoraphobia: Secondary | ICD-10-CM

## 2024-04-20 DIAGNOSIS — Z0000 Encounter for general adult medical examination without abnormal findings: Secondary | ICD-10-CM

## 2024-04-20 DIAGNOSIS — N401 Enlarged prostate with lower urinary tract symptoms: Secondary | ICD-10-CM

## 2024-04-20 MED ORDER — TAMSULOSIN HCL 0.4 MG PO CAPS: 0.4000 mg | ORAL_CAPSULE | Freq: Every day | ORAL | 3 refills | Status: AC

## 2024-04-20 MED ORDER — ALPRAZOLAM 0.25 MG PO TABS: 0.2500 mg | ORAL_TABLET | Freq: Every day | ORAL | 2 refills | Status: DC | PRN

## 2024-04-20 NOTE — Assessment & Plan Note (Signed)
Refilled tamsulosin.

## 2024-04-20 NOTE — Assessment & Plan Note (Signed)
-   A1C 6.2    - Discussed dietary modifications to prevent progression to diabetes    - Recommended limiting refined sugars, processed foods, and sweetened beverages

## 2024-04-20 NOTE — Assessment & Plan Note (Signed)
-   LDL mildly elevated    - 10-year ASCVD risk <7.5%    - Discussed lifestyle modifications including limiting refined sugars, red meat, dairy products, and fried foods    - Recommended increasing vegetable intake, fish, and lean meats

## 2024-04-20 NOTE — Patient Instructions (Signed)
 It was nice to see you today,  We addressed the following topics today: -I am sending in your medications to [place] - I sent in a CT lung cancer screening test.  Someone call you to schedule this .  I have sent it into the [place] location.   -I will look into getting records from your other providers.   Have a great day,  Etha Henle, MD

## 2024-04-20 NOTE — Assessment & Plan Note (Signed)
-   Uses Xanax  sparingly for work-related stress.  Pdmp reviewed    - Refilled Xanax  prescription

## 2024-04-20 NOTE — Progress Notes (Unsigned)
 Annual physical  Subjective   Patient ID: Billy Thomas, male    DOB: 09-28-1966  Age: 58 y.o. MRN: [id number]  Chief Complaint  Patient presents with   Annual Exam   HPI Billy Thomas is a 58 y.o. old male here  for annual exam.   Subjective - Established patient, last seen 1 year ago - No new concerns today - Reports history of kidney stones, prostatitis, and back pain over past 9-10 months - Discontinued tamsulosin  9-10 months ago, which led to significant urinary symptoms - Experienced severe left-sided testicular, penile, and groin pain - Underwent multiple imaging studies including CT scans and MRIs - Kidney stone was removed - Prostatitis treated and resolved - Diagnosed with disc herniation causing nerve pain - Treated with prednisone  for acute pain episodes - Received injections at sports medicine for back pain with good relief - Notes riding horses helps relieve back pain - Avoids operating loaders which exacerbates back pain - No current urologic issues - Missed follow-up with urologist, which led to prescription lapse  Medications: tamsulosin  (recently renewed), Xanax  (uses sparingly for stress, last used approximately 10 days ago), Tylenol  as needed, Breo Ellipta inhaler (uses once every 2-3 weeks, not twice daily as prescribed)  PMH: Sleep apnea (uses CPAP), prostatitis, kidney stones, disc herniation, hyperlipidemia, prediabetes (A1C 6.2), shoulder surgeries, history of alcohol use (quit 2008)  Social Hx: Current smoker with >30 pack-year history, 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, reports high stress levels, quit alcohol in 2008  ROS: Denies breathing problems despite smoking history  The 10-year ASCVD risk score (Arnett DK, et al., 2019) is: 6.1%  Health Maintenance Due  Topic Date Due   HIV Screening  Never done   Hepatitis C Screening  Never done   COVID-19 Vaccine (4 - 2024-25 season) 07/19/2023      Objective:     BP (!) 92/57   Pulse  72   Ht 6' (1.829 m)   Wt 218 lb 8 oz (99.1 kg)   SpO2 95%   BMI 29.63 kg/m  {Vitals History (Optional):23777}  Physical Exam Gen: alert, oriented Heent: perrla, eomi Cv: rrr Pulm: lctab Gi: soft, nbs Msk: equal b/l Psych:pleasant affect   No results found for any visits on 04/20/24.      Assessment & Plan:   Tobacco use disorder, continuous -     CT CHEST LUNG CANCER SCREENING LOW DOSE WO CONTRAST; Future  Panic attack -     ALPRAZolam ; Take 1 tablet (0.25 mg total) by mouth daily as needed.  Dispense: 30 tablet; Refill: 2  Dyslipidemia, goal LDL below 100 Assessment & Plan:    - LDL mildly elevated    - 10-year ASCVD risk <7.5%    - Discussed lifestyle modifications including limiting refined sugars, red meat, dairy products, and fried foods    - Recommended increasing vegetable intake, fish, and lean meats   Prediabetes Assessment & Plan:    - A1C 6.2    - Discussed dietary modifications to prevent progression to diabetes    - Recommended limiting refined sugars, processed foods, and sweetened beverages   GAD (generalized anxiety disorder) Assessment & Plan:    - Uses Xanax  sparingly for work-related stress.  Pdmp reviewed    - Refilled Xanax  prescription   Other orders -     Tamsulosin  HCl; Take 1 capsule (0.4 mg total) by mouth daily.  Dispense: 90 capsule; Refill: 3     Return in about 1 year (around  04/20/2025) for physical.    Laneta Pintos, MD

## 2024-04-25 ENCOUNTER — Encounter: Admitting: Family Medicine

## 2024-05-10 ENCOUNTER — Ambulatory Visit: Payer: 59 | Admitting: Internal Medicine

## 2024-10-03 ENCOUNTER — Ambulatory Visit: Admitting: Internal Medicine

## 2024-11-02 ENCOUNTER — Other Ambulatory Visit: Payer: Self-pay | Admitting: Family Medicine

## 2024-11-02 DIAGNOSIS — F410 Panic disorder [episodic paroxysmal anxiety] without agoraphobia: Secondary | ICD-10-CM

## 2024-12-21 ENCOUNTER — Other Ambulatory Visit: Payer: Self-pay | Admitting: Family Medicine

## 2024-12-21 ENCOUNTER — Encounter: Payer: Self-pay | Admitting: Family Medicine

## 2024-12-21 MED ORDER — ALBUTEROL SULFATE HFA 108 (90 BASE) MCG/ACT IN AERS: 2.0000 | INHALATION_SPRAY | Freq: Four times a day (QID) | RESPIRATORY_TRACT | 12 refills | Status: AC | PRN

## 2025-04-17 ENCOUNTER — Other Ambulatory Visit

## 2025-04-24 ENCOUNTER — Encounter: Admitting: Family Medicine
